# Patient Record
Sex: Female | Born: 1945
Health system: Southern US, Community
[De-identification: ages and names within clinical notes are randomized; demographics above are authoritative.]

## PROBLEM LIST (undated history)

## (undated) DIAGNOSIS — M1990 Unspecified osteoarthritis, unspecified site: Secondary | ICD-10-CM

## (undated) DIAGNOSIS — F419 Anxiety disorder, unspecified: Secondary | ICD-10-CM

## (undated) DIAGNOSIS — E039 Hypothyroidism, unspecified: Secondary | ICD-10-CM

## (undated) DIAGNOSIS — M8580 Other specified disorders of bone density and structure, unspecified site: Secondary | ICD-10-CM

## (undated) HISTORY — DX: Other specified disorders of bone density and structure, unspecified site: M85.80

---

## 1997-05-16 ENCOUNTER — Other Ambulatory Visit: Admission: RE | Admit: 1997-05-16 | Discharge: 1997-05-16 | Payer: Self-pay | Admitting: Obstetrics and Gynecology

## 1997-07-01 ENCOUNTER — Ambulatory Visit (HOSPITAL_COMMUNITY): Admission: RE | Admit: 1997-07-01 | Discharge: 1997-07-01 | Payer: Self-pay | Admitting: Gastroenterology

## 1998-05-26 ENCOUNTER — Other Ambulatory Visit: Admission: RE | Admit: 1998-05-26 | Discharge: 1998-05-26 | Payer: Self-pay | Admitting: Obstetrics and Gynecology

## 1999-07-17 ENCOUNTER — Other Ambulatory Visit: Admission: RE | Admit: 1999-07-17 | Discharge: 1999-07-17 | Payer: Self-pay | Admitting: Obstetrics and Gynecology

## 1999-09-03 ENCOUNTER — Encounter (INDEPENDENT_AMBULATORY_CARE_PROVIDER_SITE_OTHER): Payer: Self-pay

## 1999-09-03 ENCOUNTER — Other Ambulatory Visit: Admission: RE | Admit: 1999-09-03 | Discharge: 1999-09-03 | Payer: Self-pay | Admitting: Obstetrics and Gynecology

## 2000-08-25 ENCOUNTER — Other Ambulatory Visit: Admission: RE | Admit: 2000-08-25 | Discharge: 2000-08-25 | Payer: Self-pay | Admitting: Obstetrics and Gynecology

## 2002-07-20 ENCOUNTER — Ambulatory Visit (HOSPITAL_COMMUNITY): Admission: RE | Admit: 2002-07-20 | Discharge: 2002-07-20 | Payer: Self-pay | Admitting: Gastroenterology

## 2005-07-01 ENCOUNTER — Encounter: Payer: Self-pay | Admitting: Internal Medicine

## 2006-07-08 ENCOUNTER — Encounter: Payer: Self-pay | Admitting: Internal Medicine

## 2007-02-03 ENCOUNTER — Ambulatory Visit: Payer: Self-pay | Admitting: Internal Medicine

## 2007-02-03 DIAGNOSIS — M8580 Other specified disorders of bone density and structure, unspecified site: Secondary | ICD-10-CM | POA: Insufficient documentation

## 2007-02-06 ENCOUNTER — Encounter: Payer: Self-pay | Admitting: Internal Medicine

## 2007-05-05 ENCOUNTER — Ambulatory Visit: Payer: Self-pay | Admitting: Internal Medicine

## 2007-05-08 ENCOUNTER — Telehealth: Payer: Self-pay | Admitting: Internal Medicine

## 2007-08-04 ENCOUNTER — Encounter: Payer: Self-pay | Admitting: Internal Medicine

## 2007-08-18 ENCOUNTER — Ambulatory Visit: Payer: Self-pay | Admitting: Internal Medicine

## 2007-08-18 DIAGNOSIS — M545 Low back pain: Secondary | ICD-10-CM

## 2007-09-11 ENCOUNTER — Telehealth: Payer: Self-pay | Admitting: Internal Medicine

## 2007-09-19 ENCOUNTER — Encounter: Admission: RE | Admit: 2007-09-19 | Discharge: 2007-09-19 | Payer: Self-pay | Admitting: Internal Medicine

## 2007-09-29 ENCOUNTER — Encounter: Payer: Self-pay | Admitting: Internal Medicine

## 2007-11-06 ENCOUNTER — Telehealth (INDEPENDENT_AMBULATORY_CARE_PROVIDER_SITE_OTHER): Payer: Self-pay

## 2008-01-01 ENCOUNTER — Ambulatory Visit: Payer: Self-pay | Admitting: Internal Medicine

## 2008-01-01 DIAGNOSIS — J019 Acute sinusitis, unspecified: Secondary | ICD-10-CM | POA: Insufficient documentation

## 2008-01-08 ENCOUNTER — Telehealth: Payer: Self-pay | Admitting: Family Medicine

## 2008-08-05 ENCOUNTER — Encounter: Payer: Self-pay | Admitting: Internal Medicine

## 2008-10-25 ENCOUNTER — Encounter: Payer: Self-pay | Admitting: Internal Medicine

## 2009-08-06 ENCOUNTER — Encounter: Payer: Self-pay | Admitting: Internal Medicine

## 2009-11-24 ENCOUNTER — Ambulatory Visit: Payer: Self-pay | Admitting: Internal Medicine

## 2010-02-10 NOTE — Assessment & Plan Note (Signed)
Summary: FLU SHOT/CB  Nurse Visit   Allergies: No Known Drug Allergies  Orders Added: 1)  Admin 1st Vaccine [90471] 2)  Flu Vaccine 88yrs + [16109] Flu Vaccine Consent Questions     Do you have a history of severe allergic reactions to this vaccine? no    Any prior history of allergic reactions to egg and/or gelatin? no    Do you have a sensitivity to the preservative Thimersol? no    Do you have a past history of Guillan-Barre Syndrome? no    Do you currently have an acute febrile illness? no    Have you ever had a severe reaction to latex? no    Vaccine information given and explained to patient? yes    Are you currently pregnant? no    Lot Number:[id number]   Exp Date:07/11/2010   Site Given  Left Deltoid IM .lbflu1

## 2010-02-26 ENCOUNTER — Ambulatory Visit
Admission: RE | Admit: 2010-02-26 | Discharge: 2010-02-26 | Disposition: A | Discharge status: Home / Self Care | Payer: No Typology Code available for payment source | Source: Ambulatory Visit | Attending: Orthopedic Surgery | Admitting: Orthopedic Surgery

## 2010-02-26 ENCOUNTER — Other Ambulatory Visit: Payer: Self-pay | Admitting: Orthopedic Surgery

## 2010-02-26 DIAGNOSIS — M25562 Pain in left knee: Secondary | ICD-10-CM

## 2010-05-29 NOTE — Op Note (Signed)
   [name], [name]                       ACCOUNT NO.:  [id number]   MEDICAL RECORD NO.:  [id number]                   PATIENT TYPE:  AMB   LOCATION:  ENDO                                 FACILITY:  [facility]   PHYSICIAN:  John C. Madilyn Fireman, M.D.                 DATE OF BIRTH:  [birth year]   DATE OF PROCEDURE:  07/20/2002  DATE OF DISCHARGE:                                 OPERATIVE REPORT   PROCEDURE:  Colonoscopy.   INDICATION FOR PROCEDURE:  Family history of colon cancer in a first-degree  relative.   DESCRIPTION OF PROCEDURE:  The patient was placed in the left lateral  decubitus position and placed on the pulse monitor with continuous low-flow  oxygen delivered by nasal cannula.  She was sedated with 75 mcg IV fentanyl  and 7 mg IV Versed.  The Olympus video colonoscope was inserted into the  rectum and advanced to the cecum, confirmed by transillumination at  McBurney's point and visualization of the ileocecal valve and appendiceal  orifice.  The prep was excellent.  The cecum, ascending, transverse,  descending, and sigmoid colon all appeared normal with no masses, polyps,  diverticula, or other mucosal abnormalities.  The rectum likewise appeared  normal, and retroflexed view of the anus revealed no obvious internal  hemorrhoids.  The colonoscope was then withdrawn and the patient returned to  the recovery room in stable condition.  She tolerated the procedure well,  and there were no immediate complications.   IMPRESSION:  Normal colonoscopy.   PLAN:  We will repeat study in five years.                                               John C. Madilyn Fireman, M.D.    [facility]/MEDQ  D:  07/20/2002  T:  07/20/2002  Job:  [id number]   cc:   Gordy Savers, M.D. [facility]

## 2010-08-19 ENCOUNTER — Encounter: Payer: Self-pay | Admitting: Internal Medicine

## 2011-03-19 ENCOUNTER — Telehealth: Payer: Self-pay

## 2011-03-19 NOTE — Telephone Encounter (Signed)
Error

## 2011-03-22 ENCOUNTER — Encounter: Payer: Self-pay | Admitting: Internal Medicine

## 2011-03-22 ENCOUNTER — Ambulatory Visit (INDEPENDENT_AMBULATORY_CARE_PROVIDER_SITE_OTHER): Payer: No Typology Code available for payment source | Admitting: Internal Medicine

## 2011-03-22 DIAGNOSIS — J019 Acute sinusitis, unspecified: Secondary | ICD-10-CM

## 2011-03-22 DIAGNOSIS — E039 Hypothyroidism, unspecified: Secondary | ICD-10-CM

## 2011-03-22 DIAGNOSIS — M899 Disorder of bone, unspecified: Secondary | ICD-10-CM

## 2011-03-22 MED ORDER — ZOLPIDEM TARTRATE 10 MG PO TABS: 10.0000 mg | ORAL_TABLET | Freq: Every evening | ORAL | Status: DC | PRN

## 2011-03-22 MED ORDER — LORAZEPAM 0.5 MG PO TABS: 0.5000 mg | ORAL_TABLET | Freq: Two times a day (BID) | ORAL | Status: AC | PRN

## 2011-03-22 NOTE — Progress Notes (Signed)
  Subjective:    Patient ID: [name], female    DOB: 1945-04-03, 66 y.o.   MRN: [id number]  HPI  66 year old patient who is seen today with a seven-day history of sinus congestion drainage and cough. No fever she has a tobacco user. She does have a daughter getting married next month and is anxious to improve. Her grandson has been treated for bronchitis with antibiotic therapy. She is a retired Environmental education officer very concerned about an underactive thyroid it to involuntary weight gain over the years. TSHs been in a high normal range. She is requesting a free T3    Review of Systems  Constitutional: Positive for unexpected weight change.  HENT: Positive for congestion, rhinorrhea and postnasal drip.   Respiratory: Positive for cough.        Objective:   Physical Exam  Constitutional: She is oriented to person, place, and time. She appears well-developed and well-nourished.  HENT:  Head: Normocephalic.  Right Ear: External ear normal.  Left Ear: External ear normal.  Mouth/Throat: Oropharynx is clear and moist.  Eyes: Conjunctivae and EOM are normal. Pupils are equal, round, and reactive to light.  Neck: Normal range of motion. Neck supple. No thyromegaly present.  Cardiovascular: Normal rate, regular rhythm, normal heart sounds and intact distal pulses.   Pulmonary/Chest: Effort normal and breath sounds normal.  Abdominal: Soft. Bowel sounds are normal. She exhibits no mass. There is no tenderness.  Musculoskeletal: Normal range of motion.  Lymphadenopathy:    She has no cervical adenopathy.  Neurological: She is alert and oriented to person, place, and time.  Skin: Skin is warm and dry. No rash noted.  Psychiatric: She has a normal mood and affect. Her behavior is normal.          Assessment & Plan:   Viral rhinosinusitis Situational stress/insomnia Probable euthyroid. We'll check a free T3

## 2011-03-22 NOTE — Patient Instructions (Signed)
Acute sinusitis symptoms for less than 10 days are generally not helped by antibiotic therapy.  Use saline irrigation, warm  moist compresses and over-the-counter decongestants only as directed.  Call if there is no improvement in 5 to 7 days, or sooner if you develop increasing pain, fever, or any new symptoms.    It is important that you exercise regularly, at least 20 minutes 3 to 4 times per week.  If you develop chest pain or shortness of breath seek  medical attention.  Call or return to clinic prn if these symptoms worsen or fail to improve as anticipated.

## 2011-04-27 ENCOUNTER — Ambulatory Visit (INDEPENDENT_AMBULATORY_CARE_PROVIDER_SITE_OTHER): Payer: No Typology Code available for payment source | Admitting: Internal Medicine

## 2011-04-27 ENCOUNTER — Encounter: Payer: Self-pay | Admitting: Internal Medicine

## 2011-04-27 ENCOUNTER — Encounter (INDEPENDENT_AMBULATORY_CARE_PROVIDER_SITE_OTHER): Payer: No Typology Code available for payment source

## 2011-04-27 VITALS — BP 112/70 | Temp 98.2°F | Wt 160.0 lb

## 2011-04-27 DIAGNOSIS — M7989 Other specified soft tissue disorders: Secondary | ICD-10-CM

## 2011-04-27 NOTE — Progress Notes (Signed)
  Subjective:    Patient ID: [name], female    DOB: [birth year], 66 y.o.   MRN: [id number]  HPI 23 -year-old patient who presents with the right leg swelling of one week's duration after returning from a wedding on the [place]. She complains of some right calf tenderness and swelling but no pulmonary complaints. No prior history of DVT. She is a tobacco user    Review of Systems  Constitutional: Negative.   HENT: Negative for hearing loss, congestion, sore throat, rhinorrhea, dental problem, sinus pressure and tinnitus.   Eyes: Negative for pain, discharge and visual disturbance.  Respiratory: Negative for cough and shortness of breath.   Cardiovascular: Negative for chest pain, palpitations and leg swelling.  Gastrointestinal: Negative for nausea, vomiting, abdominal pain, diarrhea, constipation, blood in stool and abdominal distention.  Genitourinary: Negative for dysuria, urgency, frequency, hematuria, flank pain, vaginal bleeding, vaginal discharge, difficulty urinating, vaginal pain and pelvic pain.  Musculoskeletal: Negative for joint swelling, arthralgias and gait problem.       Discomfort and swelling involving the right calf and distal leg  Skin: Negative for rash.  Neurological: Negative for dizziness, syncope, speech difficulty, weakness, numbness and headaches.  Hematological: Negative for adenopathy.  Psychiatric/Behavioral: Negative for behavioral problems, dysphoric mood and agitation. The patient is not nervous/anxious.        Objective:   Physical Exam  Constitutional: She appears well-developed and well-nourished. No distress.  Cardiovascular: Regular rhythm.   Pulmonary/Chest: Effort normal and breath sounds normal.       O2 saturation 98% Pulse rate 74  Musculoskeletal: She exhibits edema.       Mild tenderness right calf and edema distal to the right knee          Assessment & Plan:   Right leg swelling rule out right leg DVT. We'll set up for  a venous Doppler study today.

## 2011-04-27 NOTE — Patient Instructions (Signed)
Venous Doppler study as discussed 

## 2011-04-28 NOTE — Progress Notes (Signed)
Quick Note:  spoke with pt- informed results neg - ______

## 2011-05-18 ENCOUNTER — Other Ambulatory Visit: Payer: Self-pay | Admitting: Internal Medicine

## 2011-05-18 NOTE — Telephone Encounter (Signed)
I dont see where this was rx'd by Dr. Amador Cunas in past or current med list.  Last seen 04/27/11 for leg swelling

## 2011-05-28 ENCOUNTER — Ambulatory Visit (INDEPENDENT_AMBULATORY_CARE_PROVIDER_SITE_OTHER): Payer: No Typology Code available for payment source | Admitting: Internal Medicine

## 2011-05-28 ENCOUNTER — Encounter: Payer: Self-pay | Admitting: Internal Medicine

## 2011-05-28 VITALS — BP 110/70 | Temp 98.2°F | Wt 156.0 lb

## 2011-05-28 DIAGNOSIS — J019 Acute sinusitis, unspecified: Secondary | ICD-10-CM

## 2011-05-28 MED ORDER — AMOXICILLIN-POT CLAVULANATE 875-125 MG PO TABS: 1.0000 | ORAL_TABLET | Freq: Two times a day (BID) | ORAL | Status: AC

## 2011-05-28 NOTE — Patient Instructions (Signed)
    Use saline irrigation, warm  moist compresses and over-the-counter decongestants only as directed.  Call if there is no improvement in 5 to 7 days, or sooner if you develop increasing pain, fever, or any new symptoms.  Take your antibiotic as prescribed until ALL of it is gone, but stop if you develop a rash, swelling, or any side effects of the medication.  Contact our office as soon as possible if  there are side effects of the medication. 

## 2011-05-28 NOTE — Progress Notes (Signed)
  Subjective:    Patient ID: [name], female    DOB: [birth year], 66 y.o.   MRN: [id number]  HPI  66 year old patient who presents with a greater than two-week history of sinus congestion and purulent drainage. She feels unwell she has congestion drainage and also productive cough there's been some intermittent low-grade fever. She has had sinus infections in the past. She has been using a number of OTC medications without much benefit including decongestants nasal irrigation and expectorants    Review of Systems  Constitutional: Negative.   HENT: Positive for congestion, postnasal drip and sinus pressure. Negative for hearing loss, sore throat, rhinorrhea, dental problem and tinnitus.   Eyes: Negative for pain, discharge and visual disturbance.  Respiratory: Positive for cough. Negative for shortness of breath.   Cardiovascular: Negative for chest pain, palpitations and leg swelling.  Gastrointestinal: Negative for nausea, vomiting, abdominal pain, diarrhea, constipation, blood in stool and abdominal distention.  Genitourinary: Negative for dysuria, urgency, frequency, hematuria, flank pain, vaginal bleeding, vaginal discharge, difficulty urinating, vaginal pain and pelvic pain.  Musculoskeletal: Negative for joint swelling, arthralgias and gait problem.  Skin: Negative for rash.  Neurological: Negative for dizziness, syncope, speech difficulty, weakness, numbness and headaches.  Hematological: Negative for adenopathy.  Psychiatric/Behavioral: Negative for behavioral problems, dysphoric mood and agitation. The patient is not nervous/anxious.        Objective:   Physical Exam  Constitutional: She is oriented to person, place, and time. She appears well-developed and well-nourished.  HENT:  Head: Normocephalic.  Right Ear: External ear normal.  Left Ear: External ear normal.  Mouth/Throat: Oropharynx is clear and moist.  Eyes: Conjunctivae and EOM are normal. Pupils are equal,  round, and reactive to light.  Neck: Normal range of motion. Neck supple. No thyromegaly present.  Cardiovascular: Normal rate, regular rhythm, normal heart sounds and intact distal pulses.   Pulmonary/Chest: Effort normal and breath sounds normal.  Abdominal: Soft. Bowel sounds are normal. She exhibits no mass. There is no tenderness.  Musculoskeletal: Normal range of motion.  Lymphadenopathy:    She has no cervical adenopathy.  Neurological: She is alert and oriented to person, place, and time.  Skin: Skin is warm and dry. No rash noted.  Psychiatric: She has a normal mood and affect. Her behavior is normal.          Assessment & Plan:   Acute sinusitis. We'll continue irrigation expectorants and decongestants. We'll treat with Augmentin for 10 days. She'll call if unimproved.

## 2011-06-21 ENCOUNTER — Other Ambulatory Visit: Payer: Self-pay

## 2011-06-21 MED ORDER — ZOLPIDEM TARTRATE 10 MG PO TABS: 10.0000 mg | ORAL_TABLET | Freq: Every evening | ORAL | Status: DC | PRN

## 2011-07-20 DIAGNOSIS — M949 Disorder of cartilage, unspecified: Secondary | ICD-10-CM | POA: Diagnosis not present

## 2011-07-20 DIAGNOSIS — M899 Disorder of bone, unspecified: Secondary | ICD-10-CM | POA: Diagnosis not present

## 2011-07-20 LAB — HM DEXA SCAN

## 2011-07-26 ENCOUNTER — Encounter: Payer: Self-pay | Admitting: Internal Medicine

## 2011-08-23 DIAGNOSIS — H2510 Age-related nuclear cataract, unspecified eye: Secondary | ICD-10-CM | POA: Diagnosis not present

## 2011-08-23 DIAGNOSIS — H40019 Open angle with borderline findings, low risk, unspecified eye: Secondary | ICD-10-CM | POA: Diagnosis not present

## 2011-08-23 DIAGNOSIS — H25019 Cortical age-related cataract, unspecified eye: Secondary | ICD-10-CM | POA: Diagnosis not present

## 2011-09-16 DIAGNOSIS — Z1231 Encounter for screening mammogram for malignant neoplasm of breast: Secondary | ICD-10-CM | POA: Diagnosis not present

## 2011-09-20 ENCOUNTER — Telehealth: Payer: Self-pay | Admitting: Internal Medicine

## 2011-09-20 ENCOUNTER — Encounter: Payer: Self-pay | Admitting: Internal Medicine

## 2011-09-20 MED ORDER — ETODOLAC 200 MG PO CAPS: 200.0000 mg | ORAL_CAPSULE | Freq: Two times a day (BID) | ORAL | Status: AC

## 2011-09-20 NOTE — Telephone Encounter (Signed)
Ok for generic lodine  #60  One BID  RF 5

## 2011-09-20 NOTE — Telephone Encounter (Signed)
Last seen here 05/28/11 Please advise

## 2011-09-20 NOTE — Telephone Encounter (Signed)
Caller: [name]/Patient; Patient Name: [name]; PCP: Eleonore Chiquito [facility]); Best Callback Phone Number: [phone]; Call regarding Lodine script for Arthritis in Knees.  Patient had Root Canal 3 weeks ago, was given Lodine for inflammation, Patient had improvement with Knee pain also, requesting script.  Per Patient, I disussed with Dr Amador Cunas at my last visit.  All emergent symptoms ruled out per Knee non-injury Protocol, home care.  Patient last seen on 05-28-11 and [facility], [phone].  Please follow up with Patient.

## 2011-09-20 NOTE — Telephone Encounter (Signed)
Patient called stating that per the md he stated he would call her shingles vaccine [facility] at [address]. Please assist.

## 2011-09-20 NOTE — Telephone Encounter (Signed)
[facility]

## 2011-09-20 NOTE — Telephone Encounter (Signed)
[facility]

## 2011-09-21 DIAGNOSIS — N6489 Other specified disorders of breast: Secondary | ICD-10-CM | POA: Diagnosis not present

## 2011-09-30 ENCOUNTER — Encounter: Payer: Self-pay | Admitting: Internal Medicine

## 2011-11-18 ENCOUNTER — Other Ambulatory Visit: Payer: Self-pay | Admitting: Internal Medicine

## 2011-12-16 DIAGNOSIS — Z124 Encounter for screening for malignant neoplasm of cervix: Secondary | ICD-10-CM | POA: Diagnosis not present

## 2011-12-16 DIAGNOSIS — Z0000 Encounter for general adult medical examination without abnormal findings: Secondary | ICD-10-CM | POA: Diagnosis not present

## 2011-12-16 DIAGNOSIS — Z01419 Encounter for gynecological examination (general) (routine) without abnormal findings: Secondary | ICD-10-CM | POA: Diagnosis not present

## 2012-01-12 HISTORY — PX: COLONOSCOPY: SHX174

## 2012-04-11 ENCOUNTER — Other Ambulatory Visit: Payer: Self-pay | Admitting: Internal Medicine

## 2012-06-27 DIAGNOSIS — L821 Other seborrheic keratosis: Secondary | ICD-10-CM | POA: Diagnosis not present

## 2012-06-27 DIAGNOSIS — L819 Disorder of pigmentation, unspecified: Secondary | ICD-10-CM | POA: Diagnosis not present

## 2012-06-27 DIAGNOSIS — C44529 Squamous cell carcinoma of skin of other part of trunk: Secondary | ICD-10-CM | POA: Diagnosis not present

## 2012-08-03 DIAGNOSIS — Z85828 Personal history of other malignant neoplasm of skin: Secondary | ICD-10-CM | POA: Diagnosis not present

## 2012-08-23 DIAGNOSIS — Z23 Encounter for immunization: Secondary | ICD-10-CM | POA: Diagnosis not present

## 2012-09-05 DIAGNOSIS — H40019 Open angle with borderline findings, low risk, unspecified eye: Secondary | ICD-10-CM | POA: Diagnosis not present

## 2012-09-05 DIAGNOSIS — H2589 Other age-related cataract: Secondary | ICD-10-CM | POA: Diagnosis not present

## 2012-09-05 DIAGNOSIS — H43819 Vitreous degeneration, unspecified eye: Secondary | ICD-10-CM | POA: Diagnosis not present

## 2012-10-02 ENCOUNTER — Encounter: Payer: Self-pay | Admitting: Internal Medicine

## 2012-10-02 ENCOUNTER — Ambulatory Visit (INDEPENDENT_AMBULATORY_CARE_PROVIDER_SITE_OTHER): Payer: Medicare Other | Admitting: Internal Medicine

## 2012-10-02 VITALS — BP 120/74 | HR 82 | Temp 98.2°F | Resp 20 | Wt 157.0 lb

## 2012-10-02 DIAGNOSIS — J019 Acute sinusitis, unspecified: Secondary | ICD-10-CM

## 2012-10-02 MED ORDER — LORAZEPAM 0.5 MG PO TABS: 0.5000 mg | ORAL_TABLET | Freq: Every day | ORAL | Status: DC | PRN

## 2012-10-02 MED ORDER — ZOLPIDEM TARTRATE 10 MG PO TABS: ORAL_TABLET | ORAL | Status: DC

## 2012-10-02 MED ORDER — AMOXICILLIN-POT CLAVULANATE 875-125 MG PO TABS: 1.0000 | ORAL_TABLET | Freq: Two times a day (BID) | ORAL | Status: DC

## 2012-10-02 NOTE — Patient Instructions (Addendum)
Use saline irrigation, warm  moist compresses and over-the-counter decongestants only as directed.  Call if there is no improvement in 5 to 7 days, or sooner if you develop increasing pain, fever, or any new symptoms.  Continue annual gynecologic evaluations

## 2012-10-02 NOTE — Progress Notes (Signed)
Subjective:    Patient ID: [name], female    DOB: 1945-04-22, 67 y.o.   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 No definite fever. She's had worsening headaches postnasal drip in spite of OTC medications that have included decongestants as well as expectorants and analgesics. She has been treated for sinusitis in the past. Her last episode of acute sinusitis was approximately 18 months ago  Past Medical History  Diagnosis Date  . Osteopenia     History   Social History  . Marital Status: Married    Spouse Name: N/A    Number of Children: N/A  . Years of Education: N/A   Occupational History  . Not on file.   Social History Main Topics  . Smoking status: Former Smoker    Types: Cigarettes    Quit date: 03/12/2011  . Smokeless tobacco: Never Used  . Alcohol Use: Yes  . Drug Use: No  . Sexual Activity: Not on file   Other Topics Concern  . Not on file   Social History Narrative  . No narrative on file    History reviewed. No pertinent past surgical history.  Family History  Problem Relation Age of Onset  . Heart disease Mother   . Arthritis Mother   . Hypertension Father   . Arthritis Father     No Known Allergies  Current Outpatient Prescriptions on File Prior to Visit  Medication Sig Dispense Refill  . Multiple Vitamin (MULTIVITAMIN) tablet Take 1 tablet by mouth daily.      . vitamin C (ASCORBIC ACID) 500 MG tablet Take 500 mg by mouth daily.      . vitamin E 400 UNIT capsule Take 400 Units by mouth daily.       No current facility-administered medications on file prior to visit.    BP 120/74  Pulse 82  Temp(Src) 98.2 F (36.8 C) (Oral)  Resp 20  Wt 157 lb (71.215 kg)  BMI 27.82 kg/m2  SpO2 97%       Review of Systems  Constitutional: Negative.   HENT: Positive for congestion, rhinorrhea and sinus pressure. Negative for  hearing loss, sore throat, dental problem and tinnitus.   Eyes: Negative for pain, discharge and visual disturbance.  Respiratory: Negative for cough and shortness of breath.   Cardiovascular: Negative for chest pain, palpitations and leg swelling.  Gastrointestinal: Negative for nausea, vomiting, abdominal pain, diarrhea, constipation, blood in stool and abdominal distention.  Genitourinary: Negative for dysuria, urgency, frequency, hematuria, flank pain, vaginal bleeding, vaginal discharge, difficulty urinating, vaginal pain and pelvic pain.  Musculoskeletal: Negative for joint swelling, arthralgias and gait problem.  Skin: Negative for rash.  Neurological: Positive for headaches. Negative for dizziness, syncope, speech difficulty, weakness and numbness.  Hematological: Negative for adenopathy.  Psychiatric/Behavioral: Negative for behavioral problems, dysphoric mood and agitation. The patient is not nervous/anxious.        Objective:   Physical Exam  Constitutional: She is oriented to person, place, and time. She appears well-developed and well-nourished.  HENT:  Head: Normocephalic.  Right Ear: External ear normal.  Left Ear: External ear normal.  Mouth/Throat: Oropharynx is clear and moist.  Mild maxillary sinus tenderness  Eyes: Conjunctivae and EOM are normal. Pupils are equal, round, and reactive to light.  Neck: Normal range of motion. Neck supple. No thyromegaly present.  Cardiovascular: Normal rate, regular rhythm, normal heart  sounds and intact distal pulses.   Pulmonary/Chest: Effort normal and breath sounds normal.  Abdominal: Soft. Bowel sounds are normal. She exhibits no mass. There is no tenderness.  Musculoskeletal: Normal range of motion.  Lymphadenopathy:    She has no cervical adenopathy.  Neurological: She is alert and oriented to person, place, and time.  Skin: Skin is warm and dry. No rash noted.  Psychiatric: She has a normal mood and affect. Her behavior is  normal.          Assessment & Plan:   Subacute sinusitis. Will continue expectorants saline irrigation and treat with 10 days of Augmentin History of osteopenia  Recommend annual gynecologic evaluation where she obtains her primary care

## 2012-10-05 DIAGNOSIS — Z1231 Encounter for screening mammogram for malignant neoplasm of breast: Secondary | ICD-10-CM | POA: Diagnosis not present

## 2012-10-16 ENCOUNTER — Encounter: Payer: Self-pay | Admitting: Internal Medicine

## 2012-10-16 ENCOUNTER — Ambulatory Visit (INDEPENDENT_AMBULATORY_CARE_PROVIDER_SITE_OTHER): Payer: Medicare Other | Admitting: Internal Medicine

## 2012-10-16 ENCOUNTER — Telehealth: Payer: Self-pay | Admitting: Internal Medicine

## 2012-10-16 VITALS — BP 140/80 | HR 83 | Temp 97.9°F | Resp 20 | Wt 156.0 lb

## 2012-10-16 DIAGNOSIS — J019 Acute sinusitis, unspecified: Secondary | ICD-10-CM | POA: Diagnosis not present

## 2012-10-16 MED ORDER — FLUTICASONE PROPIONATE 50 MCG/ACT NA SUSP: 2.0000 | Freq: Every day | NASAL | Status: DC

## 2012-10-16 MED ORDER — PREDNISONE 20 MG PO TABS: 20.0000 mg | ORAL_TABLET | Freq: Two times a day (BID) | ORAL | Status: DC

## 2012-10-16 MED ORDER — AZITHROMYCIN 250 MG PO TABS: ORAL_TABLET | ORAL | Status: DC

## 2012-10-16 NOTE — Patient Instructions (Signed)
Use saline irrigation, warm  moist compresses and over-the-counter decongestants only as directed.  Call if there is no improvement in 5 to 7 days, or sooner if you develop increasing pain, fever, or any new symptoms.  Allegra D. twice daily

## 2012-10-16 NOTE — Telephone Encounter (Signed)
Patient Information:  Caller Name: Shaton  Phone: [phone]  Patient: [name], [name]  Gender: Female  DOB: [birth year]  Age: 67 Years  PCP: Eleonore Chiquito (Family Practice > 19yrs old)  Office Follow Up:  Does the office need to follow up with this patient?: N/A  Instructions For The Office: N/A  RN Note:  No headache, ear pain, or facial pain.  Audible ear popping and ear fullness present.  Audibly congested. Advised must be seen for antibioitics per MD order. Hydrate and humidify to loosen congestion  Symptoms  Reason For Call & Symptoms: Ongoing sinus congestion, yellow/green drainage and ear congestion.  Sinus infection diagnosed 10/02/12; treated with 10 days of Augmentin that ended  10/11/12.  Reviewed Health History In EMR: Yes  Reviewed Medications In EMR: Yes  Reviewed Allergies In EMR: Yes  Reviewed Surgeries / Procedures: Yes  Date of Onset of Symptoms: 09/18/2012  Treatments Tried: Sudafed, Advil, Guaifenesin, nasal saline, > fluid intake  Treatments Tried Worked: No  Guideline(s) Used:  Sinus Pain and Congestion  Disposition Per Guideline:   See Today or Tomorrow in Office  Reason For Disposition Reached:   Sinus congestion (pressure, fullness) present > 10 days  Advice Given:  N/A  Patient Will Follow Care Advice:  YES  Appointment Scheduled:  10/16/2012 16:00:00 Appointment Scheduled Provider:  Eleonore Chiquito (Family Practice > 2yrs old)

## 2012-10-16 NOTE — Progress Notes (Signed)
Subjective:    Patient ID: [name], female    DOB: [birth year], 67 y.o.   MRN: [id number]  HPI  67 year old patient who was seen recently and treated for subacute sinusitis with Augmentin for 10 days. She seemed to improve somewhat but now has persistent sinus congestion and minimal drainage. There's been no fever. Denies any focal sinus tenderness  She's a former smoker but discontinued about one year ago  Past Medical History  Diagnosis Date  . Osteopenia     History   Social History  . Marital Status: Married    Spouse Name: N/A    Number of Children: N/A  . Years of Education: N/A   Occupational History  . Not on file.   Social History Main Topics  . Smoking status: Former Smoker    Types: Cigarettes    Quit date: 03/12/2011  . Smokeless tobacco: Never Used  . Alcohol Use: Yes  . Drug Use: No  . Sexual Activity: Not on file   Other Topics Concern  . Not on file   Social History Narrative  . No narrative on file    History reviewed. No pertinent past surgical history.  Family History  Problem Relation Age of Onset  . Heart disease Mother   . Arthritis Mother   . Hypertension Father   . Arthritis Father     No Known Allergies  Current Outpatient Prescriptions on File Prior to Visit  Medication Sig Dispense Refill  . LORazepam (ATIVAN) 0.5 MG tablet Take 1 tablet (0.5 mg total) by mouth daily as needed.  30 tablet  2  . Multiple Vitamin (MULTIVITAMIN) tablet Take 1 tablet by mouth daily.      . vitamin C (ASCORBIC ACID) 500 MG tablet Take 500 mg by mouth daily.      . vitamin E 400 UNIT capsule Take 400 Units by mouth daily.      Marland Kitchen zolpidem (AMBIEN) 10 MG tablet TAKE 1 TABLET AT BEDTIME AS NEEDED FOR SLEEP  30 tablet  2   No current facility-administered medications on file prior to visit.    BP 140/80  Pulse 83  Temp(Src) 97.9 F (36.6 C) (Oral)  Resp 20  Wt 156 lb (70.761 kg)  BMI 27.64 kg/m2  SpO2 98%       Review of Systems   Constitutional: Positive for fatigue.  HENT: Positive for congestion, rhinorrhea and postnasal drip. Negative for hearing loss, sore throat, dental problem, sinus pressure and tinnitus.   Eyes: Negative for pain, discharge and visual disturbance.  Respiratory: Negative for cough and shortness of breath.   Cardiovascular: Negative for chest pain, palpitations and leg swelling.  Gastrointestinal: Negative for nausea, vomiting, abdominal pain, diarrhea, constipation, blood in stool and abdominal distention.  Genitourinary: Negative for dysuria, urgency, frequency, hematuria, flank pain, vaginal bleeding, vaginal discharge, difficulty urinating, vaginal pain and pelvic pain.  Musculoskeletal: Negative for joint swelling, arthralgias and gait problem.  Skin: Negative for rash.  Neurological: Negative for dizziness, syncope, speech difficulty, weakness, numbness and headaches.  Hematological: Negative for adenopathy.  Psychiatric/Behavioral: Negative for behavioral problems, dysphoric mood and agitation. The patient is not nervous/anxious.        Objective:   Physical Exam  Constitutional: She is oriented to person, place, and time. She appears well-developed and well-nourished.  HENT:  Head: Normocephalic.  Right Ear: External ear normal.  Left Ear: External ear normal.  Mouth/Throat: Oropharynx is clear and moist.  Eyes: Conjunctivae and EOM are normal. Pupils  are equal, round, and reactive to light.  Neck: Normal range of motion. Neck supple. No thyromegaly present.  Cardiovascular: Normal rate, regular rhythm, normal heart sounds and intact distal pulses.   Pulmonary/Chest: Effort normal and breath sounds normal.  Abdominal: Soft. Bowel sounds are normal. She exhibits no mass. There is no tenderness.  Musculoskeletal: Normal range of motion.  Lymphadenopathy:    She has no cervical adenopathy.  Neurological: She is alert and oriented to person, place, and time.  Skin: Skin is warm and  dry. No rash noted.  Psychiatric: She has a normal mood and affect. Her behavior is normal.          Assessment & Plan:   Sinus congestion. Will treat aggressively with a short course of oral prednisone continue decongestants expectorants sinus irrigation.  We'll retreat with azithromycin to cover the possibility of a low-grade sinusitis

## 2012-11-08 DIAGNOSIS — M1710 Unilateral primary osteoarthritis, unspecified knee: Secondary | ICD-10-CM | POA: Diagnosis not present

## 2012-11-24 DIAGNOSIS — Z1211 Encounter for screening for malignant neoplasm of colon: Secondary | ICD-10-CM | POA: Diagnosis not present

## 2012-11-24 DIAGNOSIS — K5730 Diverticulosis of large intestine without perforation or abscess without bleeding: Secondary | ICD-10-CM | POA: Diagnosis not present

## 2012-11-24 DIAGNOSIS — Z800 Family history of malignant neoplasm of digestive organs: Secondary | ICD-10-CM | POA: Diagnosis not present

## 2012-11-29 DIAGNOSIS — Z85828 Personal history of other malignant neoplasm of skin: Secondary | ICD-10-CM | POA: Diagnosis not present

## 2012-11-29 DIAGNOSIS — L905 Scar conditions and fibrosis of skin: Secondary | ICD-10-CM | POA: Diagnosis not present

## 2012-11-29 DIAGNOSIS — L819 Disorder of pigmentation, unspecified: Secondary | ICD-10-CM | POA: Diagnosis not present

## 2012-11-29 DIAGNOSIS — L821 Other seborrheic keratosis: Secondary | ICD-10-CM | POA: Diagnosis not present

## 2012-11-29 DIAGNOSIS — D235 Other benign neoplasm of skin of trunk: Secondary | ICD-10-CM | POA: Diagnosis not present

## 2012-12-13 ENCOUNTER — Encounter: Payer: Self-pay | Admitting: Internal Medicine

## 2012-12-26 DIAGNOSIS — Z0000 Encounter for general adult medical examination without abnormal findings: Secondary | ICD-10-CM | POA: Diagnosis not present

## 2012-12-26 DIAGNOSIS — Z01419 Encounter for gynecological examination (general) (routine) without abnormal findings: Secondary | ICD-10-CM | POA: Diagnosis not present

## 2012-12-26 DIAGNOSIS — Z124 Encounter for screening for malignant neoplasm of cervix: Secondary | ICD-10-CM | POA: Diagnosis not present

## 2012-12-27 DIAGNOSIS — Z124 Encounter for screening for malignant neoplasm of cervix: Secondary | ICD-10-CM | POA: Diagnosis not present

## 2012-12-29 DIAGNOSIS — Z23 Encounter for immunization: Secondary | ICD-10-CM | POA: Diagnosis not present

## 2013-05-30 DIAGNOSIS — D485 Neoplasm of uncertain behavior of skin: Secondary | ICD-10-CM | POA: Diagnosis not present

## 2013-05-30 DIAGNOSIS — L723 Sebaceous cyst: Secondary | ICD-10-CM | POA: Diagnosis not present

## 2013-05-30 DIAGNOSIS — L905 Scar conditions and fibrosis of skin: Secondary | ICD-10-CM | POA: Diagnosis not present

## 2013-05-30 DIAGNOSIS — L910 Hypertrophic scar: Secondary | ICD-10-CM | POA: Diagnosis not present

## 2013-07-20 DIAGNOSIS — M899 Disorder of bone, unspecified: Secondary | ICD-10-CM | POA: Diagnosis not present

## 2013-07-20 DIAGNOSIS — M949 Disorder of cartilage, unspecified: Secondary | ICD-10-CM | POA: Diagnosis not present

## 2013-07-20 LAB — HM DEXA SCAN

## 2013-08-08 ENCOUNTER — Encounter: Payer: Self-pay | Admitting: Internal Medicine

## 2013-09-11 LAB — HM MAMMOGRAPHY

## 2013-09-26 DIAGNOSIS — H40019 Open angle with borderline findings, low risk, unspecified eye: Secondary | ICD-10-CM | POA: Diagnosis not present

## 2013-09-26 DIAGNOSIS — H2510 Age-related nuclear cataract, unspecified eye: Secondary | ICD-10-CM | POA: Diagnosis not present

## 2013-10-08 DIAGNOSIS — Z803 Family history of malignant neoplasm of breast: Secondary | ICD-10-CM | POA: Diagnosis not present

## 2013-10-08 DIAGNOSIS — Z1231 Encounter for screening mammogram for malignant neoplasm of breast: Secondary | ICD-10-CM | POA: Diagnosis not present

## 2013-10-29 DIAGNOSIS — Z23 Encounter for immunization: Secondary | ICD-10-CM | POA: Diagnosis not present

## 2013-12-05 DIAGNOSIS — M1712 Unilateral primary osteoarthritis, left knee: Secondary | ICD-10-CM | POA: Diagnosis not present

## 2013-12-27 DIAGNOSIS — H0011 Chalazion right upper eyelid: Secondary | ICD-10-CM | POA: Diagnosis not present

## 2013-12-27 DIAGNOSIS — H16101 Unspecified superficial keratitis, right eye: Secondary | ICD-10-CM | POA: Diagnosis not present

## 2014-01-18 DIAGNOSIS — Z0000 Encounter for general adult medical examination without abnormal findings: Secondary | ICD-10-CM | POA: Diagnosis not present

## 2014-01-18 DIAGNOSIS — Z01419 Encounter for gynecological examination (general) (routine) without abnormal findings: Secondary | ICD-10-CM | POA: Diagnosis not present

## 2014-05-31 DIAGNOSIS — L821 Other seborrheic keratosis: Secondary | ICD-10-CM | POA: Diagnosis not present

## 2014-05-31 DIAGNOSIS — L814 Other melanin hyperpigmentation: Secondary | ICD-10-CM | POA: Diagnosis not present

## 2014-05-31 DIAGNOSIS — L820 Inflamed seborrheic keratosis: Secondary | ICD-10-CM | POA: Diagnosis not present

## 2014-05-31 DIAGNOSIS — D225 Melanocytic nevi of trunk: Secondary | ICD-10-CM | POA: Diagnosis not present

## 2014-05-31 DIAGNOSIS — L570 Actinic keratosis: Secondary | ICD-10-CM | POA: Diagnosis not present

## 2014-07-02 ENCOUNTER — Encounter: Payer: Self-pay | Admitting: *Deleted

## 2014-10-01 DIAGNOSIS — H40013 Open angle with borderline findings, low risk, bilateral: Secondary | ICD-10-CM | POA: Diagnosis not present

## 2014-10-01 DIAGNOSIS — H2513 Age-related nuclear cataract, bilateral: Secondary | ICD-10-CM | POA: Diagnosis not present

## 2014-10-23 DIAGNOSIS — Z23 Encounter for immunization: Secondary | ICD-10-CM | POA: Diagnosis not present

## 2014-10-23 DIAGNOSIS — Z1231 Encounter for screening mammogram for malignant neoplasm of breast: Secondary | ICD-10-CM | POA: Diagnosis not present

## 2014-10-23 LAB — HM MAMMOGRAPHY: HM Mammogram: NEGATIVE

## 2014-11-07 ENCOUNTER — Encounter: Payer: Self-pay | Admitting: Internal Medicine

## 2015-01-21 ENCOUNTER — Other Ambulatory Visit (INDEPENDENT_AMBULATORY_CARE_PROVIDER_SITE_OTHER): Payer: Medicare Other

## 2015-01-21 DIAGNOSIS — Z0000 Encounter for general adult medical examination without abnormal findings: Secondary | ICD-10-CM

## 2015-01-21 DIAGNOSIS — D649 Anemia, unspecified: Secondary | ICD-10-CM

## 2015-01-21 DIAGNOSIS — E039 Hypothyroidism, unspecified: Secondary | ICD-10-CM

## 2015-01-21 DIAGNOSIS — E785 Hyperlipidemia, unspecified: Secondary | ICD-10-CM

## 2015-01-21 DIAGNOSIS — I519 Heart disease, unspecified: Secondary | ICD-10-CM

## 2015-01-21 LAB — CBC WITH DIFFERENTIAL/PLATELET
BASOS PCT: 0.7 % (ref 0.0–3.0)
Basophils Absolute: 0 10*3/uL (ref 0.0–0.1)
EOS ABS: 0.1 10*3/uL (ref 0.0–0.7)
EOS PCT: 1.8 % (ref 0.0–5.0)
HEMATOCRIT: 41.8 % (ref 36.0–46.0)
HEMOGLOBIN: 14 g/dL (ref 12.0–15.0)
Lymphocytes Relative: 48 % — ABNORMAL HIGH (ref 12.0–46.0)
Lymphs Abs: 2.6 10*3/uL (ref 0.7–4.0)
MCHC: 33.5 g/dL (ref 30.0–36.0)
MCV: 97.6 fl (ref 78.0–100.0)
Monocytes Absolute: 0.4 10*3/uL (ref 0.1–1.0)
Monocytes Relative: 7.9 % (ref 3.0–12.0)
Neutro Abs: 2.3 10*3/uL (ref 1.4–7.7)
Neutrophils Relative %: 41.6 % — ABNORMAL LOW (ref 43.0–77.0)
Platelets: 268 10*3/uL (ref 150.0–400.0)
RBC: 4.29 Mil/uL (ref 3.87–5.11)
RDW: 12.7 % (ref 11.5–15.5)
WBC: 5.5 10*3/uL (ref 4.0–10.5)

## 2015-01-21 LAB — TSH: TSH: 4.16 u[IU]/mL (ref 0.35–4.50)

## 2015-01-21 LAB — POCT URINALYSIS DIPSTICK
BILIRUBIN UA: NEGATIVE
Glucose, UA: NEGATIVE
KETONES UA: NEGATIVE
LEUKOCYTES UA: NEGATIVE
Nitrite, UA: NEGATIVE
PH UA: 6
PROTEIN UA: NEGATIVE
RBC UA: NEGATIVE
SPEC GRAV UA: 1.01
Urobilinogen, UA: 0.2

## 2015-01-21 LAB — HEPATIC FUNCTION PANEL
ALBUMIN: 4.2 g/dL (ref 3.5–5.2)
ALK PHOS: 59 U/L (ref 39–117)
ALT: 15 U/L (ref 0–35)
AST: 17 U/L (ref 0–37)
Bilirubin, Direct: 0.1 mg/dL (ref 0.0–0.3)
TOTAL PROTEIN: 6.9 g/dL (ref 6.0–8.3)
Total Bilirubin: 0.6 mg/dL (ref 0.2–1.2)

## 2015-01-21 LAB — LIPID PANEL
CHOL/HDL RATIO: 3
Cholesterol: 192 mg/dL (ref 0–200)
HDL: 62.1 mg/dL (ref >39.00)
LDL CALC: 107 mg/dL — AB (ref 0–99)
NonHDL: 129.65
TRIGLYCERIDES: 111 mg/dL (ref 0.0–149.0)
VLDL: 22.2 mg/dL (ref 0.0–40.0)

## 2015-01-21 LAB — BASIC METABOLIC PANEL
BUN: 14 mg/dL (ref 6–23)
CALCIUM: 9.2 mg/dL (ref 8.4–10.5)
CO2: 29 mEq/L (ref 19–32)
CREATININE: 0.62 mg/dL (ref 0.40–1.20)
Chloride: 103 mEq/L (ref 96–112)
GFR: 101.59 mL/min (ref >60.00)
GLUCOSE: 85 mg/dL (ref 70–99)
Potassium: 4.5 mEq/L (ref 3.5–5.1)
SODIUM: 138 meq/L (ref 135–145)

## 2015-01-28 ENCOUNTER — Ambulatory Visit (INDEPENDENT_AMBULATORY_CARE_PROVIDER_SITE_OTHER): Payer: Medicare Other | Admitting: Internal Medicine

## 2015-01-28 ENCOUNTER — Encounter: Payer: Self-pay | Admitting: Internal Medicine

## 2015-01-28 VITALS — BP 160/90 | HR 79 | Temp 98.0°F | Resp 20 | Ht 61.5 in | Wt 155.0 lb

## 2015-01-28 DIAGNOSIS — M1712 Unilateral primary osteoarthritis, left knee: Secondary | ICD-10-CM

## 2015-01-28 DIAGNOSIS — Z0000 Encounter for general adult medical examination without abnormal findings: Secondary | ICD-10-CM

## 2015-01-28 MED ORDER — LORAZEPAM 0.5 MG PO TABS: 0.5000 mg | ORAL_TABLET | Freq: Every day | ORAL | Status: DC | PRN

## 2015-01-28 MED ORDER — ZOLPIDEM TARTRATE 10 MG PO TABS: ORAL_TABLET | ORAL | Status: DC

## 2015-01-28 NOTE — Progress Notes (Signed)
Subjective:    Patient ID: [name], female    DOB: [birth year], 70 y.o.   MRN: [id number]  HPI  70 year old patient who is seen today for a preventive health examination. Patient has a history of osteoarthritis and has been told she may need left knee total replacement therapy. She has occasional insomnia but rarely requires medications.  Family history father died at 69 history of early dementia and hypertension Mother died age 69, required pneumonia, history of hypothyroidism and hypertension  2 brothers one sister  Social history married, retired Therapist, sports.  2 daughters and multiple grandchildren  Past Medical History  Diagnosis Date  . Osteopenia     Social History   Social History  . Marital Status: Married    Spouse Name: N/A  . Number of Children: N/A  . Years of Education: N/A   Occupational History  . Not on file.   Social History Main Topics  . Smoking status: Former Smoker    Types: Cigarettes    Quit date: 03/12/2011  . Smokeless tobacco: Never Used  . Alcohol Use: Yes  . Drug Use: No  . Sexual Activity: Not on file   Other Topics Concern  . Not on file   Social History Narrative    No past surgical history on file.  Family History  Problem Relation Age of Onset  . Heart disease Mother   . Arthritis Mother   . Hypertension Father   . Arthritis Father     No Known Allergies  Current Outpatient Prescriptions on File Prior to Visit  Medication Sig Dispense Refill  . Multiple Vitamin (MULTIVITAMIN) tablet Take 1 tablet by mouth daily.    . vitamin C (ASCORBIC ACID) 500 MG tablet Take 500 mg by mouth daily.    . vitamin E 400 UNIT capsule Take 400 Units by mouth daily.     No current facility-administered medications on file prior to visit.    BP 160/90 mmHg  Pulse 79  Temp(Src) 98 F (36.7 C) (Oral)  Resp 20  Ht 5' 1.5" (1.562 m)  Wt 155 lb (70.308 kg)  BMI 28.82 kg/m2  SpO2 99%  1. Risk factors, based on past  M,S,F history.   No significant cardiovascular risk factors  2.  Physical activities: Limited somewhat by left knee pain, but no significant restrictions  3.  Depression/mood: No history depression or mood disorder  4.  Hearing: No deficits  5.  ADL's: Independent  6.  Fall risk: Low  7.  Home safety: No problems identified  8.  Height weight, and visual acuity; height and weight stable no change in visual acuity is followed by ophthalmology and is a glaucoma suspect.  Has had 2 bone density studies  9.  Counseling: Continue heart healthy diet and regular exercise  10. Lab orders based on risk factors: Laboratory profile including lipid profile reviewed  11. Referral : Follow-up OB/GYN  12. Care plan: Continue heart healthy diet  13. Cognitive assessment: Alert in order with normal affect no cognitive dysfunction  14. Screening: Patient provided with a written and personalized 5-10 year screening schedule in the AVS.  .  We'll continue annual clinical exams with screening lab.  Patient will continue have mammograms at 1 or 2 year intervals.  Will continue colonoscopies at five-year intervals.  Last colonoscopy 2014  15. Provider List Update: Includes primary care medicine OB/GYN ophthalmology and GI     Review of Systems  Constitutional: Negative.   HENT: Negative  for congestion, dental problem, hearing loss, rhinorrhea, sinus pressure, sore throat and tinnitus.   Eyes: Negative for pain, discharge and visual disturbance.  Respiratory: Negative for cough and shortness of breath.   Cardiovascular: Negative for chest pain, palpitations and leg swelling.  Gastrointestinal: Negative for nausea, vomiting, abdominal pain, diarrhea, constipation, blood in stool and abdominal distention.  Genitourinary: Negative for dysuria, urgency, frequency, hematuria, flank pain, vaginal bleeding, vaginal discharge, difficulty urinating, vaginal pain and pelvic pain.  Musculoskeletal: Negative for joint swelling,  arthralgias and gait problem.  Skin: Negative for rash.  Neurological: Negative for dizziness, syncope, speech difficulty, weakness, numbness and headaches.  Hematological: Negative for adenopathy.  Psychiatric/Behavioral: Negative for behavioral problems, dysphoric mood and agitation. The patient is not nervous/anxious.        Objective:   Physical Exam  Constitutional: She is oriented to person, place, and time. She appears well-developed and well-nourished.  HENT:  Head: Normocephalic and atraumatic.  Right Ear: External ear normal.  Left Ear: External ear normal.  Mouth/Throat: Oropharynx is clear and moist.  Eyes: Conjunctivae and EOM are normal.  Neck: Normal range of motion. Neck supple. No JVD present. No thyromegaly present.  Cardiovascular: Normal rate, regular rhythm, normal heart sounds and intact distal pulses.   No murmur heard. Pulmonary/Chest: Effort normal and breath sounds normal. She has no wheezes. She has no rales.  Abdominal: Soft. Bowel sounds are normal. She exhibits no distension and no mass. There is no tenderness. There is no rebound and no guarding.  Musculoskeletal: Normal range of motion. She exhibits no edema or tenderness.  Mild hypertrophic changes left knee  Neurological: She is alert and oriented to person, place, and time. She has normal reflexes. No cranial nerve deficit. She exhibits normal muscle tone. Coordination normal.  Skin: Skin is warm and dry. No rash noted.  Psychiatric: She has a normal mood and affect. Her behavior is normal.          Assessment & Plan:   Preventive health examination Osteoarthritis History of osteopenia.  Continue calcium and vitamin D supplements Glaucoma suspect.  Follow-up ophthalmology  Return here in one year or as needed

## 2015-01-28 NOTE — Patient Instructions (Signed)
Limit your sodium (Salt) intake  Please check your blood pressure on a regular basis.  If it is consistently greater than 140/90, please make an office appointment.    It is important that you exercise regularly, at least 20 minutes 3 to 4 times per week.  If you develop chest pain or shortness of breath seek  medical attention.  Take a calcium supplement, plus [phone] units of vitamin D

## 2015-01-28 NOTE — Progress Notes (Signed)
Pre visit review using our clinic review tool, if applicable. No additional management support is needed unless otherwise documented below in the visit note. 

## 2015-05-27 ENCOUNTER — Ambulatory Visit (INDEPENDENT_AMBULATORY_CARE_PROVIDER_SITE_OTHER): Payer: Medicare Other | Admitting: Internal Medicine

## 2015-05-27 ENCOUNTER — Encounter: Payer: Self-pay | Admitting: Internal Medicine

## 2015-05-27 VITALS — BP 144/80 | HR 76 | Temp 98.1°F | Resp 20 | Ht 61.5 in | Wt 153.0 lb

## 2015-05-27 DIAGNOSIS — J0121 Acute recurrent ethmoidal sinusitis: Secondary | ICD-10-CM

## 2015-05-27 MED ORDER — LORAZEPAM 0.5 MG PO TABS: 0.5000 mg | ORAL_TABLET | Freq: Every day | ORAL | Status: DC | PRN

## 2015-05-27 MED ORDER — AZITHROMYCIN 250 MG PO TABS: ORAL_TABLET | ORAL | Status: DC

## 2015-05-27 MED ORDER — ZOLPIDEM TARTRATE 10 MG PO TABS: ORAL_TABLET | ORAL | Status: DC

## 2015-05-27 NOTE — Progress Notes (Signed)
   Subjective:    Patient ID: [name], female    DOB: [birth year], 70 y.o.   MRN: [id number]  HPI  70 year old patient who has a history of intermittent sinusitis.  She was last treated in the fall of 2014 and initially failed Augmentin.  She was then treated with azithromycin and improved She has been ill for 10-12 days with worsening sinus congestion.  She has developed yellow-green drainage, worsening sinus pressure, pain and low-grade fever.  She has been using Sudafed, Mucinex and Advil as well as Nasonex without benefit  BP Readings from Last 3 Encounters:  05/27/15 144/80  01/28/15 160/90  10/16/12 140/80    Review of Systems  Constitutional: Positive for fever, activity change, appetite change and fatigue.  HENT: Positive for congestion, postnasal drip, rhinorrhea and sinus pressure. Negative for dental problem, hearing loss, sore throat and tinnitus.   Eyes: Negative for pain, discharge and visual disturbance.  Respiratory: Negative for cough and shortness of breath.   Cardiovascular: Negative for chest pain, palpitations and leg swelling.  Gastrointestinal: Negative for nausea, vomiting, abdominal pain, diarrhea, constipation, blood in stool and abdominal distention.  Genitourinary: Negative for dysuria, urgency, frequency, hematuria, flank pain, vaginal bleeding, vaginal discharge, difficulty urinating, vaginal pain and pelvic pain.  Musculoskeletal: Negative for joint swelling, arthralgias and gait problem.  Skin: Negative for rash.  Neurological: Negative for dizziness, syncope, speech difficulty, weakness, numbness and headaches.  Hematological: Negative for adenopathy.  Psychiatric/Behavioral: Negative for behavioral problems, dysphoric mood and agitation. The patient is not nervous/anxious.        Objective:   Physical Exam  Constitutional: She is oriented to person, place, and time. She appears well-developed and well-nourished.  HENT:  Head: Normocephalic.   Right Ear: External ear normal.  Left Ear: External ear normal.  Mouth/Throat: Oropharynx is clear and moist.  Eyes: Conjunctivae and EOM are normal. Pupils are equal, round, and reactive to light.  Neck: Normal range of motion. Neck supple. No thyromegaly present.  Cardiovascular: Normal rate, regular rhythm, normal heart sounds and intact distal pulses.   Pulmonary/Chest: Effort normal and breath sounds normal.  Abdominal: Soft. Bowel sounds are normal. She exhibits no mass. There is no tenderness.  Musculoskeletal: Normal range of motion.  Lymphadenopathy:    She has no cervical adenopathy.  Neurological: She is alert and oriented to person, place, and time.  Skin: Skin is warm and dry. No rash noted.  Psychiatric: She has a normal mood and affect. Her behavior is normal.          Assessment & Plan:   Acute sinusitis.  Patient did not respond well to Augmentin in 2014 but did well with azithromycin.  Will retreat with azithromycin  Continue low-salt diet and home blood pressure monitoring

## 2015-05-27 NOTE — Progress Notes (Signed)
Pre visit review using our clinic review tool, if applicable. No additional management support is needed unless otherwise documented below in the visit note. 

## 2015-05-27 NOTE — Patient Instructions (Signed)
HOME CARE INSTRUCTIONS  Drink plenty of water. Water helps thin the mucus so your sinuses can drain more easily.  Use a humidifier.  Inhale steam 3-4 times a day (for example, sit in the bathroom with the shower running).  Apply a warm, moist washcloth to your face 3-4 times a day, or as directed by your health care provider.  Use saline nasal sprays to help moisten and clean your sinuses.  Take medicines only as directed by your health care provider.  If you were prescribed either an antibiotic or antifungal medicine, finish it all even if you start to feel better.   Please check your blood pressure on a regular basis.  If it is consistently greater than 140/90, please make an office appointment, please call or make an office appointment

## 2015-06-02 DIAGNOSIS — L820 Inflamed seborrheic keratosis: Secondary | ICD-10-CM | POA: Diagnosis not present

## 2015-06-02 DIAGNOSIS — L814 Other melanin hyperpigmentation: Secondary | ICD-10-CM | POA: Diagnosis not present

## 2015-06-02 DIAGNOSIS — D235 Other benign neoplasm of skin of trunk: Secondary | ICD-10-CM | POA: Diagnosis not present

## 2015-06-02 DIAGNOSIS — L821 Other seborrheic keratosis: Secondary | ICD-10-CM | POA: Diagnosis not present

## 2015-06-19 DIAGNOSIS — M1712 Unilateral primary osteoarthritis, left knee: Secondary | ICD-10-CM | POA: Diagnosis not present

## 2015-08-12 DIAGNOSIS — M810 Age-related osteoporosis without current pathological fracture: Secondary | ICD-10-CM | POA: Diagnosis not present

## 2015-08-12 LAB — HM DEXA SCAN

## 2015-09-09 ENCOUNTER — Other Ambulatory Visit: Payer: Self-pay

## 2015-10-07 DIAGNOSIS — H40013 Open angle with borderline findings, low risk, bilateral: Secondary | ICD-10-CM | POA: Diagnosis not present

## 2015-10-07 DIAGNOSIS — H2513 Age-related nuclear cataract, bilateral: Secondary | ICD-10-CM | POA: Diagnosis not present

## 2015-10-09 ENCOUNTER — Encounter: Payer: Self-pay | Admitting: Internal Medicine

## 2015-10-21 DIAGNOSIS — Z23 Encounter for immunization: Secondary | ICD-10-CM | POA: Diagnosis not present

## 2015-10-27 DIAGNOSIS — Z1231 Encounter for screening mammogram for malignant neoplasm of breast: Secondary | ICD-10-CM | POA: Diagnosis not present

## 2015-10-27 LAB — HM MAMMOGRAPHY

## 2015-10-30 ENCOUNTER — Encounter: Payer: Self-pay | Admitting: Internal Medicine

## 2016-02-12 DIAGNOSIS — M1712 Unilateral primary osteoarthritis, left knee: Secondary | ICD-10-CM | POA: Diagnosis not present

## 2016-03-17 ENCOUNTER — Ambulatory Visit (INDEPENDENT_AMBULATORY_CARE_PROVIDER_SITE_OTHER): Payer: Medicare Other | Admitting: Internal Medicine

## 2016-03-17 ENCOUNTER — Encounter: Payer: Self-pay | Admitting: Internal Medicine

## 2016-03-17 VITALS — BP 122/78 | HR 78 | Temp 98.1°F | Ht 61.5 in | Wt 156.0 lb

## 2016-03-17 DIAGNOSIS — G4709 Other insomnia: Secondary | ICD-10-CM | POA: Diagnosis not present

## 2016-03-17 DIAGNOSIS — Z0000 Encounter for general adult medical examination without abnormal findings: Secondary | ICD-10-CM

## 2016-03-17 DIAGNOSIS — Z23 Encounter for immunization: Secondary | ICD-10-CM

## 2016-03-17 LAB — COMPREHENSIVE METABOLIC PANEL
ALT: 14 U/L (ref 0–35)
AST: 17 U/L (ref 0–37)
Albumin: 4.4 g/dL (ref 3.5–5.2)
Alkaline Phosphatase: 64 U/L (ref 39–117)
BILIRUBIN TOTAL: 0.7 mg/dL (ref 0.2–1.2)
BUN: 17 mg/dL (ref 6–23)
CALCIUM: 9.8 mg/dL (ref 8.4–10.5)
CO2: 29 meq/L (ref 19–32)
CREATININE: 0.73 mg/dL (ref 0.40–1.20)
Chloride: 104 mEq/L (ref 96–112)
GFR: 83.86 mL/min (ref >60.00)
Glucose, Bld: 89 mg/dL (ref 70–99)
Potassium: 5.5 mEq/L — ABNORMAL HIGH (ref 3.5–5.1)
SODIUM: 135 meq/L (ref 135–145)
Total Protein: 6.8 g/dL (ref 6.0–8.3)

## 2016-03-17 LAB — CBC WITH DIFFERENTIAL/PLATELET
BASOS ABS: 0.1 10*3/uL (ref 0.0–0.1)
Basophils Relative: 1.2 % (ref 0.0–3.0)
EOS ABS: 0.1 10*3/uL (ref 0.0–0.7)
Eosinophils Relative: 2.5 % (ref 0.0–5.0)
HEMATOCRIT: 43.3 % (ref 36.0–46.0)
Hemoglobin: 14.7 g/dL (ref 12.0–15.0)
LYMPHS ABS: 2.3 10*3/uL (ref 0.7–4.0)
LYMPHS PCT: 44 % (ref 12.0–46.0)
MCHC: 33.9 g/dL (ref 30.0–36.0)
MCV: 98.7 fl (ref 78.0–100.0)
MONO ABS: 0.5 10*3/uL (ref 0.1–1.0)
Monocytes Relative: 9.4 % (ref 3.0–12.0)
NEUTROS ABS: 2.2 10*3/uL (ref 1.4–7.7)
NEUTROS PCT: 42.9 % — AB (ref 43.0–77.0)
PLATELETS: 311 10*3/uL (ref 150.0–400.0)
RBC: 4.39 Mil/uL (ref 3.87–5.11)
RDW: 12.6 % (ref 11.5–15.5)
WBC: 5.1 10*3/uL (ref 4.0–10.5)

## 2016-03-17 LAB — LIPID PANEL
CHOL/HDL RATIO: 3
CHOLESTEROL: 205 mg/dL — AB (ref 0–200)
HDL: 61.8 mg/dL (ref >39.00)
LDL Cholesterol: 120 mg/dL — ABNORMAL HIGH (ref 0–99)
NonHDL: 143.59
TRIGLYCERIDES: 120 mg/dL (ref 0.0–149.0)
VLDL: 24 mg/dL (ref 0.0–40.0)

## 2016-03-17 LAB — TSH: TSH: 4.64 u[IU]/mL — ABNORMAL HIGH (ref 0.35–4.50)

## 2016-03-17 MED ORDER — LORAZEPAM 0.5 MG PO TABS: 0.5000 mg | ORAL_TABLET | Freq: Every day | ORAL | 2 refills | Status: DC | PRN

## 2016-03-17 MED ORDER — ZOLPIDEM TARTRATE 10 MG PO TABS: ORAL_TABLET | ORAL | 2 refills | Status: DC

## 2016-03-17 NOTE — Progress Notes (Signed)
Pre visit review using our clinic review tool, if applicable. No additional management support is needed unless otherwise documented below in the visit note. 

## 2016-03-17 NOTE — Addendum Note (Signed)
Addended by: Abelardo Diesel on: 03/17/2016 10:10 AM   Modules accepted: Orders

## 2016-03-17 NOTE — Progress Notes (Signed)
Subjective:    Patient ID: [name], female    DOB: [birth year], 71 y.o.   MRN: [id number]  HPI  71 year old patient who is seen today in follow-up. She has a history of occasional insomnia and is requesting a refill on Ambien. Other complaints include hair loss.  She is concerned about possible thyroid dysfunction.  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 Osteopenia      Social History   Social History  . Marital status: Married    Spouse name: N/A  . Number of children: N/A  . Years of education: N/A   Occupational History  . Not on file.   Social History Main Topics  . Smoking status: Former Smoker    Types: Cigarettes    Quit date: 03/12/2011  . Smokeless tobacco: Never Used  . Alcohol use Yes  . Drug use: No  . Sexual activity: Not on file   Other Topics Concern  . Not on file   Social History Narrative  . No narrative on file    No past surgical history on file.  Family History  Problem Relation Age of Onset  . Heart disease Mother   . Arthritis Mother   . Hypertension Father   . Arthritis Father     No Known Allergies  Current Outpatient Prescriptions on File Prior to Visit  Medication Sig Dispense Refill  . Multiple Vitamin (MULTIVITAMIN) tablet Take 1 tablet by mouth daily.    . Omega-3 Fatty Acids (FISH OIL) 1200 MG CAPS Take 1 capsule by mouth daily.    Vladimir Faster Glycol-Propyl Glycol (SYSTANE) 0.4-0.3 % SOLN Apply 1 drop to eye 3 (three) times daily.    . vitamin C (ASCORBIC ACID) 500 MG tablet Take 500 mg by mouth daily.    . vitamin E 400 UNIT capsule Take 400 Units by mouth daily.     No current  facility-administered medications on file prior to visit.     BP 122/78 (BP Location: Left Arm, Patient Position: Sitting, Cuff Size: Normal)   Pulse 78   Temp 98.1 F (36.7 C) (Oral)   Ht 5' 1.5" (1.562 m)   Wt 156 lb (70.8 kg)   SpO2 99%   BMI 29.00 kg/m     Review of Systems  Constitutional: Negative.   HENT: Negative for congestion, dental problem, hearing loss, rhinorrhea, sinus pressure, sore throat and tinnitus.   Eyes: Negative for pain, discharge and visual disturbance.  Respiratory: Negative for cough and shortness of breath.   Cardiovascular: Negative for chest pain, palpitations and leg swelling.  Gastrointestinal: Negative for abdominal distention, abdominal pain, blood in stool, constipation, diarrhea, nausea and vomiting.  Genitourinary: Negative for difficulty urinating, dysuria, flank pain, frequency, hematuria, pelvic pain, urgency, vaginal bleeding, vaginal discharge and vaginal pain.  Musculoskeletal: Negative for arthralgias, gait problem and joint swelling.  Skin: Negative for rash.  Neurological: Negative for dizziness, syncope, speech difficulty, weakness, numbness and headaches.  Hematological: Negative for adenopathy.  Psychiatric/Behavioral: Positive for sleep disturbance. Negative for agitation, behavioral problems and dysphoric mood. The patient is nervous/anxious.        Objective:   Physical Exam  Constitutional: She is oriented  to person, place, and time. She appears well-developed and well-nourished.  Blood pressure 110/76  HENT:  Head: Normocephalic.  Right Ear: External ear normal.  Left Ear: External ear normal.  Mouth/Throat: Oropharynx is clear and moist.  Eyes: Conjunctivae and EOM are normal. Pupils are equal, round, and reactive to light.  Neck: Normal range of motion. Neck supple. No thyromegaly present.  Cardiovascular: Normal rate, regular rhythm, normal heart sounds and intact distal pulses.   Pulmonary/Chest: Effort normal and  breath sounds normal.  Abdominal: Soft. Bowel sounds are normal. She exhibits no mass. There is no tenderness.  Musculoskeletal: Normal range of motion.  Lymphadenopathy:    She has no cervical adenopathy.  Neurological: She is alert and oriented to person, place, and time.  Skin: Skin is warm and dry. No rash noted.  Psychiatric: She has a normal mood and affect. Her behavior is normal.          Assessment & Plan:   Preventive health.  We'll check screening lab Thinning of the hair.  Will check a thyroid indices Family history of thyroid disorder Situation insomnia.  Ambien refilled  Continue annual mammograms Continue vitamin D and calcium supplementation  Recheck one year or as needed  [employer]

## 2016-03-17 NOTE — Patient Instructions (Signed)
It is important that you exercise regularly, at least 20 minutes 3 to 4 times per week.  If you develop chest pain or shortness of breath seek  medical attention.  Take a calcium supplement, plus 800-1200 units of vitamin D  Return in one year for follow-up   

## 2016-05-25 ENCOUNTER — Telehealth: Payer: Self-pay

## 2016-05-25 NOTE — Telephone Encounter (Signed)
PA approved, form faxed back to pharmacy. 

## 2016-05-25 NOTE — Telephone Encounter (Signed)
Received PA request from [facility] for Zolpidem 10 mg tablet. PA submitted & is pending. Key: [id number]

## 2016-06-16 ENCOUNTER — Telehealth: Payer: Self-pay

## 2016-06-16 ENCOUNTER — Ambulatory Visit: Payer: Medicare Other

## 2016-06-16 VITALS — BP 132/70 | HR 70 | Ht 62.0 in | Wt 155.0 lb

## 2016-06-16 DIAGNOSIS — Z1159 Encounter for screening for other viral diseases: Secondary | ICD-10-CM

## 2016-06-16 DIAGNOSIS — G4700 Insomnia, unspecified: Secondary | ICD-10-CM

## 2016-06-16 DIAGNOSIS — Z0000 Encounter for general adult medical examination without abnormal findings: Secondary | ICD-10-CM

## 2016-06-16 DIAGNOSIS — R7989 Other specified abnormal findings of blood chemistry: Secondary | ICD-10-CM

## 2016-06-16 NOTE — Patient Instructions (Addendum)
[name] , Thank you for taking time to come for your Medicare Wellness Visit. I appreciate your ongoing commitment to your health goals. Please review the following plan we discussed and let me know if I can assist you in the future.   Insomnia; say Goodnight to insomnia   May repeat dexa in a year; 09/2017  If dr. Raliegh Ip thinks it's necessary  Manuela Schwartz will call and get your mammogram report for [facility]   Colonoscopy repeat in 2019; screens updated   May consider GYN / pelvic; no pap needed   You can have a hearing screen anywhere you like; this is covered by Medicare      These are the goals we discussed: Goals    . Weight (lb) < 145 lb (65.8 kg)          Check out  online nutrition programs as GumSearch.nl and http://vang.com/; fit16m; Can use lose it  May want to keep a diary for approx 1 month; to see if weight is responds to calorie reduction You can use calorieking.com  Look for foods with "whole" wheat; bran; oatmeal etc Shot at the farmer's markets in season for fresher choices  Watch for "hydrogenated" on the label of oils which are trans-fats.  Watch for "high fructose corn syrup" in snacks, yogurt or ketchup  Meats have less marbling; bright colored fruits and vegetables;  Canned; dump out liquid and wash vegetables. Be mindful of what we are eating  Portion control is essential to a health weight! Sit down; take a break and enjoy your meal; take smaller bites; put the fork down between bites;  It takes 20 minutes to get full; so check in with your fullness cues and stop eating when you start to fill full              This is a list of the screening recommended for you and due dates:  Health Maintenance  Topic Date Due  .  Hepatitis C: One time screening is recommended by Center for Disease Control  (CDC) for  adults born from [birth year]through 1964.   [birth year] . Flu Shot  08/11/2016  . Mammogram  10/26/2016  . Colon Cancer Screening  11/24/2017  . Tetanus  Vaccine  06/12/2022  . DEXA scan (bone density measurement)  Completed  . Pneumonia vaccines  Completed   Prevention of falls: Remove rugs or any tripping hazards in the home Use Non slip mats in bathtubs and showers Placing grab bars next to the toilet and or shower Placing handrails on both sides of the stair way Adding extra lighting in the home.   Personal safety issues reviewed:  1. Consider starting a community watch program [facility].  Changes batteries is smoke detector and/or carbon monoxide detector  3.  If you have firearms; keep them in a safe place 4.  Wear protection when in the sun; Always wear sunscreen or a hat; It is good to have your doctor check your skin annually or review any new areas of concern 5. Driving safety; Keep in the right lane; stay 3 car lengths behind the car in front of you on the highway; look 3 times prior to pulling out; carry your cell phone everywhere you go!    Learn about the Yellow Dot program:  The program allows first responders at your emergency to have access to who your physician is, as well as your medications and medical conditions.  Citizens requesting the Yellow Dot  Packages should contact Master Corporal Nunzio Cobbs at the [facility] [phone] for the first week of the program and beginning the week after Easter citizens should contact their Scientist, physiological.   Health Maintenance, Female Adopting a healthy lifestyle and getting preventive care can go a long way to promote health and wellness. Talk with your health care provider about what schedule of regular examinations is right for you. This is a good chance for you to check in with your provider about disease prevention and staying healthy. In between checkups, there are plenty of things you can do on your own. Experts have done a lot of research about which lifestyle changes and preventive measures are most likely to keep you  healthy. Ask your health care provider for more information. Weight and diet Eat a healthy diet  Be sure to include plenty of vegetables, fruits, low-fat dairy products, and lean protein.  Do not eat a lot of foods high in solid fats, added sugars, or salt.  Get regular exercise. This is one of the most important things you can do for your health. ? Most adults should exercise for at least 150 minutes each week. The exercise should increase your heart rate and make you sweat (moderate-intensity exercise). ? Most adults should also do strengthening exercises at least twice a week. This is in addition to the moderate-intensity exercise.  Maintain a healthy weight  Body mass index (BMI) is a measurement that can be used to identify possible weight problems. It estimates body fat based on height and weight. Your health care provider can help determine your BMI and help you achieve or maintain a healthy weight.  For females 58 years of age and older: ? A BMI below 18.5 is considered underweight. ? A BMI of 18.5 to 24.9 is normal. ? A BMI of 25 to 29.9 is considered overweight. ? A BMI of 30 and above is considered obese.  Watch levels of cholesterol and blood lipids  You should start having your blood tested for lipids and cholesterol at 71 years of age, then have this test every 5 years.  You may need to have your cholesterol levels checked more often if: ? Your lipid or cholesterol levels are high. ? You are older than 70 years of age. ? You are at high risk for heart disease.  Cancer screening Lung Cancer  Lung cancer screening is recommended for adults 34-4 years old who are at high risk for lung cancer because of a history of smoking.  A yearly low-dose CT scan of the lungs is recommended for people who: ? Currently smoke. ? Have quit within the past 15 years. ? Have at least a 30-pack-year history of smoking. A pack year is smoking an average of one pack of cigarettes a day  for 1 year.  Yearly screening should continue until it has been 15 years since you quit.  Yearly screening should stop if you develop a health problem that would prevent you from having lung cancer treatment.  Breast Cancer  Practice breast self-awareness. This means understanding how your breasts normally appear and feel.  It also means doing regular breast self-exams. Let your health care provider know about any changes, no matter how small.  If you are in your 20s or 30s, you should have a clinical breast exam (CBE) by a health care provider every 1-3 years as part of a regular health exam.  If you are 84 or older, have a  CBE every year. Also consider having a breast X-ray (mammogram) every year.  If you have a family history of breast cancer, talk to your health care provider about genetic screening.  If you are at high risk for breast cancer, talk to your health care provider about having an MRI and a mammogram every year.  Breast cancer gene (BRCA) assessment is recommended for women who have family members with BRCA-related cancers. BRCA-related cancers include: ? Breast. ? Ovarian. ? Tubal. ? Peritoneal cancers.  Results of the assessment will determine the need for genetic counseling and BRCA1 and BRCA2 testing.  Cervical Cancer Your health care provider may recommend that you be screened regularly for cancer of the pelvic organs (ovaries, uterus, and vagina). This screening involves a pelvic examination, including checking for microscopic changes to the surface of your cervix (Pap test). You may be encouraged to have this screening done every 3 years, beginning at age 27.  For women ages 61-65, health care providers may recommend pelvic exams and Pap testing every 3 years, or they may recommend the Pap and pelvic exam, combined with testing for human papilloma virus (HPV), every 5 years. Some types of HPV increase your risk of cervical cancer. Testing for HPV may also be done  on women of any age with unclear Pap test results.  Other health care providers may not recommend any screening for nonpregnant women who are considered low risk for pelvic cancer and who do not have symptoms. Ask your health care provider if a screening pelvic exam is right for you.  If you have had past treatment for cervical cancer or a condition that could lead to cancer, you need Pap tests and screening for cancer for at least 20 years after your treatment. If Pap tests have been discontinued, your risk factors (such as having a new sexual partner) need to be reassessed to determine if screening should resume. Some women have medical problems that increase the chance of getting cervical cancer. In these cases, your health care provider may recommend more frequent screening and Pap tests.  Colorectal Cancer  This type of cancer can be detected and often prevented.  Routine colorectal cancer screening usually begins at 70 years of age and continues through 70 years of age.  Your health care provider may recommend screening at an earlier age if you have risk factors for colon cancer.  Your health care provider may also recommend using home test kits to check for hidden blood in the stool.  A small camera at the end of a tube can be used to examine your colon directly (sigmoidoscopy or colonoscopy). This is done to check for the earliest forms of colorectal cancer.  Routine screening usually begins at age 39.  Direct examination of the colon should be repeated every 5-10 years through 70 years of age. However, you may need to be screened more often if early forms of precancerous polyps or small growths are found.  Skin Cancer  Check your skin from head to toe regularly.  Tell your health care provider about any new moles or changes in moles, especially if there is a change in a mole's shape or color.  Also tell your health care provider if you have a mole that is larger than the size of  a pencil eraser.  Always use sunscreen. Apply sunscreen liberally and repeatedly throughout the day.  Protect yourself by wearing long sleeves, pants, a wide-brimmed hat, and sunglasses whenever you are outside.  Heart disease,  diabetes, and high blood pressure  High blood pressure causes heart disease and increases the risk of stroke. High blood pressure is more likely to develop in: ? People who have blood pressure in the high end of the normal range (130-139/85-89 mm Hg). ? People who are overweight or obese. ? People who are African American.  If you are 19-18 years of age, have your blood pressure checked every 3-5 years. If you are 48 years of age or older, have your blood pressure checked every year. You should have your blood pressure measured twice-once when you are at a hospital or clinic, and once when you are not at a hospital or clinic. Record the average of the two measurements. To check your blood pressure when you are not at a hospital or clinic, you can use: ? An automated blood pressure machine at a pharmacy. ? A home blood pressure monitor.  If you are between 5 years and 36 years old, ask your health care provider if you should take aspirin to prevent strokes.  Have regular diabetes screenings. This involves taking a blood sample to check your fasting blood sugar level. ? If you are at a normal weight and have a low risk for diabetes, have this test once every three years after 70 years of age. ? If you are overweight and have a high risk for diabetes, consider being tested at a younger age or more often. Preventing infection Hepatitis B  If you have a higher risk for hepatitis B, you should be screened for this virus. You are considered at high risk for hepatitis B if: ? You were born in a country where hepatitis B is common. Ask your health care provider which countries are considered high risk. ? Your parents were born in a high-risk country, and you have not been  immunized against hepatitis B (hepatitis B vaccine). ? You have HIV or AIDS. ? You use needles to inject street drugs. ? You live with someone who has hepatitis B. ? You have had sex with someone who has hepatitis B. ? You get hemodialysis treatment. ? You take certain medicines for conditions, including cancer, organ transplantation, and autoimmune conditions.  Hepatitis C  Blood testing is recommended for: ? Everyone born from [birth year] through 1964. ? Anyone with known risk factors for hepatitis C.  Sexually transmitted infections (STIs)  You should be screened for sexually transmitted infections (STIs) including gonorrhea and chlamydia if: ? You are sexually active and are younger than 70 years of age. ? You are older than 70 years of age and your health care provider tells you that you are at risk for this type of infection. ? Your sexual activity has changed since you were last screened and you are at an increased risk for chlamydia or gonorrhea. Ask your health care provider if you are at risk.  If you do not have HIV, but are at risk, it may be recommended that you take a prescription medicine daily to prevent HIV infection. This is called pre-exposure prophylaxis (PrEP). You are considered at risk if: ? You are sexually active and do not regularly use condoms or know the HIV status of your partner(s). ? You take drugs by injection. ? You are sexually active with a partner who has HIV.  Talk with your health care provider about whether you are at high risk of being infected with HIV. If you choose to begin PrEP, you should first be tested for HIV. You should then  be tested every 3 months for as long as you are taking PrEP. Pregnancy  If you are premenopausal and you may become pregnant, ask your health care provider about preconception counseling.  If you may become pregnant, take 400 to 800 micrograms (mcg) of folic acid every day.  If you want to prevent pregnancy, talk to your  health care provider about birth control (contraception). Osteoporosis and menopause  Osteoporosis is a disease in which the bones lose minerals and strength with aging. This can result in serious bone fractures. Your risk for osteoporosis can be identified using a bone density scan.  If you are 76 years of age or older, or if you are at risk for osteoporosis and fractures, ask your health care provider if you should be screened.  Ask your health care provider whether you should take a calcium or vitamin D supplement to lower your risk for osteoporosis.  Menopause may have certain physical symptoms and risks.  Hormone replacement therapy may reduce some of these symptoms and risks. Talk to your health care provider about whether hormone replacement therapy is right for you. Follow these instructions at home:  Schedule regular health, dental, and eye exams.  Stay current with your immunizations.  Do not use any tobacco products including cigarettes, chewing tobacco, or electronic cigarettes.  If you are pregnant, do not drink alcohol.  If you are breastfeeding, limit how much and how often you drink alcohol.  Limit alcohol intake to no more than 1 drink per day for nonpregnant women. One drink equals 12 ounces of beer, 5 ounces of wine, or 1 ounces of hard liquor.  Do not use street drugs.  Do not share needles.  Ask your health care provider for help if you need support or information about quitting drugs.  Tell your health care provider if you often feel depressed.  Tell your health care provider if you have ever been abused or do not feel safe at home. This information is not intended to replace advice given to you by your health care provider. Make sure you discuss any questions you have with your health care provider. Document Released: 07/13/2010 Document Revised: 06/05/2015 Document Reviewed: 10/01/2014 Elsevier Interactive Patient Education  2018 [place] in the Home Falls can cause injuries and can affect people from all age groups. There are many simple things that you can do to make your home safe and to help prevent falls. What can I do on the outside of my home?  Regularly repair the edges of walkways and driveways and fix any cracks.  Remove high doorway thresholds.  Trim any shrubbery on the main path into your home.  Use bright outdoor lighting.  Clear walkways of debris and clutter, including tools and rocks.  Regularly check that handrails are securely fastened and in good repair. Both sides of any steps should have handrails.  Install guardrails along the edges of any raised decks or porches.  Have leaves, snow, and ice cleared regularly.  Use sand or salt on walkways during winter months.  In the garage, clean up any spills right away, including grease or oil spills. What can I do in the bathroom?  Use night lights.  Install grab bars by the toilet and in the tub and shower. Do not use towel bars as grab bars.  Use non-skid mats or decals on the floor of the tub or shower.  If you need to sit down while you are  in the shower, use a plastic, non-slip stool.  Keep the floor dry. Immediately clean up any water that spills on the floor.  Remove soap buildup in the tub or shower on a regular basis.  Attach bath mats securely with double-sided non-slip rug tape.  Remove throw rugs and other tripping hazards from the floor. What can I do in the bedroom?  Use night lights.  Make sure that a bedside light is easy to reach.  Do not use oversized bedding that drapes onto the floor.  Have a firm chair that has side arms to use for getting dressed.  Remove throw rugs and other tripping hazards from the floor. What can I do in the kitchen?  Clean up any spills right away.  Avoid walking on wet floors.  Place frequently used items in easy-to-reach places.  If you need to reach for  something above you, use a sturdy step stool that has a grab bar.  Keep electrical cables out of the way.  Do not use floor polish or wax that makes floors slippery. If you have to use wax, make sure that it is non-skid floor wax.  Remove throw rugs and other tripping hazards from the floor. What can I do in the stairways?  Do not leave any items on the stairs.  Make sure that there are handrails on both sides of the stairs. Fix handrails that are broken or loose. Make sure that handrails are as long as the stairways.  Check any carpeting to make sure that it is firmly attached to the stairs. Fix any carpet that is loose or worn.  Avoid having throw rugs at the top or bottom of stairways, or secure the rugs with carpet tape to prevent them from moving.  Make sure that you have a light switch at the top of the stairs and the bottom of the stairs. If you do not have them, have them installed. What are some other fall prevention tips?  Wear closed-toe shoes that fit well and support your feet. Wear shoes that have rubber soles or low heels.  When you use a stepladder, make sure that it is completely opened and that the sides are firmly locked. Have someone hold the ladder while you are using it. Do not climb a closed stepladder.  Add color or contrast paint or tape to grab bars and handrails in your home. Place contrasting color strips on the first and last steps.  Use mobility aids as needed, such as canes, walkers, scooters, and crutches.  Turn on lights if it is dark. Replace any light bulbs that burn out.  Set up furniture so that there are clear paths. Keep the furniture in the same spot.  Fix any uneven floor surfaces.  Choose a carpet design that does not hide the edge of steps of a stairway.  Be aware of any and all pets.  Review your medicines with your healthcare provider. Some medicines can cause dizziness or changes in blood pressure, which increase your risk of  falling. Talk with your health care provider about other ways that you can decrease your risk of falls. This may include working with a physical therapist or trainer to improve your strength, balance, and endurance. This information is not intended to replace advice given to you by your health care provider. Make sure you discuss any questions you have with your health care provider. Document Released: 12/18/2001 Document Revised: 05/27/2015 Document Reviewed: 02/01/2014 Elsevier Interactive Patient Education  2017 [employer]

## 2016-06-16 NOTE — Progress Notes (Signed)
I have reviewed and agree with note, evaluation, plan. I agree a follow up with Dr. Raliegh Ip is advisable to discuss TSH and insomnia concerns.   Garret Reddish, MD

## 2016-06-16 NOTE — Progress Notes (Signed)
Subjective:   [name] is a 71 y.o. female who presents for Medicare Annual (Subsequent) preventive examination.  The Patient was informed that the wellness visit is to identify future health risk and educate and initiate measures that can reduce risk for increased disease through the lifespan.    NO ROS; Medicare Wellness Visit  Describes health as good, fair or great? 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 She will consider Can make apt with Dr. Raliegh Ip to [place] due 10/2015; due 10/2016    Smoking history 15 pack years  Second Hand Smoke status; No Smokers in the home ETOH - 2 glasses of wine in the evening  Medication adherence or issues?  no  RISK FACTORS Diet -  Tries to eat as many fruits and veg Watches carbs  Diet had not changed but as slow gained weight since she was 61   Regular exercise  Exercise x 5 days a week at home;  Arm weight; leg lifts x 60 minutes  Does her own house work [employer] work Never just sits around; stays active  Cardiac Risk Factors:  Advanced aged  >65 in women Hyperlipidemia HDL 61; Trig 120  Diabetes - Family History - HD and OA  Obesity - BMI 28.4   Fall risk  neg Given education on "Fall Prevention in the Home" for more safety tips the patient can apply as appropriate.    Mobility of Functional changes this year? no   Mental Health:  Any emotional problems? Anxious, depressed, irritable, sad  or blue? no Denies feeling depressed or hopeless; voices pleasure in daily life How many social activities have you been engaged in within the last 2 weeks? Who would help you with chores; illness; shopping other? No  Hearing Screening Comments: Haring issues Some hearing loss  Vision Screening Comments: Vision check q year Dr. Leanne Lovely Dr Cena Benton  Eye asso Checking for glaucoma but is negative     Activities of Daily Living - See functional screen   Cognitive testing; Ad8 score; 0 or less than 2  MMSE deferred or completed if AD8 + 2 issues  Advanced Directives completed   Patient Care Team: Marletta Lor, MD as PCP - General Went to Dr. Ulanda Edison; still has uterus and ovaries  May consider continuing for pelvic exams  Immunization History  Administered Date(s) Administered  . Influenza Whole 11/24/2009  . Influenza, High Dose Seasonal PF 10/23/2014, 10/21/2015  . Pneumococcal Conjugate-13 10/23/2014  . Pneumococcal Polysaccharide-23 03/17/2016  . Tdap 06/11/2012   Required Immunizations needed today  Screening test up to date or reviewed for plan of completion Health Maintenance Due  Topic Date Due  . Hepatitis C Screening  [birth year]  agreed to order and will obtain at the next blood draw  Cardiac Risk Factors include: advanced age (>41men, >31 women)     Objective:     Vitals: BP 132/70   Pulse  70   Ht 5\' 2"  (1.575 m)   Wt 155 lb (70.3 kg)   SpO2 98%   BMI 28.35 kg/m   Body mass index is 28.35 kg/m.   Tobacco History  Smoking Status  . Former Smoker  . Packs/day: 0.50  . Years: 30.00  . Types: Cigarettes  . Quit date: 03/12/2011  Smokeless Tobacco  . Never Used     Counseling given: Yes   Past Medical History:  Diagnosis Date  . Osteopenia    No past surgical history on file. Family History  Problem Relation Age of Onset  . Heart disease Mother   . Arthritis Mother   . Hypertension Father   . Arthritis Father    History  Sexual  Activity  . Sexual activity: Not on file    Outpatient Encounter Prescriptions as of 06/16/2016  Medication Sig  . LORazepam (ATIVAN) 0.5 MG tablet Take 1 tablet (0.5 mg total) by mouth daily as needed.  . Multiple Vitamin (MULTIVITAMIN) tablet Take 1 tablet by mouth daily.  . Omega-3 Fatty Acids (FISH OIL) 1200 MG CAPS Take 1 capsule by mouth daily.  Vladimir Faster Glycol-Propyl Glycol (SYSTANE) 0.4-0.3 % SOLN Apply 1 drop to eye 3 (three) times daily.  . vitamin C (ASCORBIC ACID) 500 MG tablet Take 500 mg by mouth daily.  . vitamin E 400 UNIT capsule Take 400 Units by mouth daily.  Marland Kitchen zolpidem (AMBIEN) 10 MG tablet TAKE 1 TABLET AT BEDTIME AS NEEDED FOR SLEEP   No facility-administered encounter medications on file as of 06/16/2016.     Activities of Daily Living In your present state of health, do you have any difficulty performing the following activities: 06/16/2016  Hearing? N  Vision? N  Difficulty concentrating or making decisions? N  Walking or climbing stairs? N  Dressing or bathing? N  Doing errands, shopping? N  Preparing Food and eating ? N  Using the Toilet? N  In the past six months, have you accidently leaked urine? N  Do you have problems with loss of bowel control? N  Managing your Medications? N  Managing your Finances? N  Housekeeping or managing your Housekeeping? N  Some recent data might be hidden    Patient Care Team: Marletta Lor, MD as PCP - General    Assessment:     Exercise Activities and Dietary recommendations Current Exercise Habits: Home exercise routine, Time (Minutes): 60, Frequency (Times/Week): 5, Weekly Exercise (Minutes/Week): 300, Intensity: Moderate  Goals    . Weight (lb) < 145 lb (65.8 kg)          Check out  online nutrition programs as GumSearch.nl and http://vang.com/; fit27me; Can use lose it  May want to keep a diary for approx 1 month; to see if weight is responds to calorie reduction You can use  calorieking.com  Look for foods with "whole" wheat; bran; oatmeal etc Shot at the farmer's markets in season for fresher choices  Watch for "hydrogenated" on the label of oils which are trans-fats.  Watch for "high fructose corn syrup" in snacks, yogurt or ketchup  Meats have less marbling; bright colored fruits and vegetables;  Canned; dump out liquid and wash vegetables. Be mindful of what we are eating  Portion control is essential to a health weight! Sit down; take a break and enjoy your meal; take smaller bites; put the fork down between bites;  It takes 20 minutes to get full; so check in with your fullness cues and stop  eating when you start to fill full             Fall Risk Fall Risk  06/16/2016 03/17/2016 09/09/2015  Falls in the past year? No No No   Depression Screen PHQ 2/9 Scores 06/16/2016 03/17/2016  PHQ - 2 Score 0 0     Cognitive Function MMSE - Mini Mental State Exam 06/16/2016  Not completed: (No Data)        Immunization History  Administered Date(s) Administered  . Influenza Whole 11/24/2009  . Influenza, High Dose Seasonal PF 10/23/2014, 10/21/2015  . Pneumococcal Conjugate-13 10/23/2014  . Pneumococcal Polysaccharide-23 03/17/2016  . Tdap 06/11/2012   Screening Tests Health Maintenance  Topic Date Due  . Hepatitis C Screening  19-Jan-1945  . INFLUENZA VACCINE  08/11/2016  . MAMMOGRAM  10/26/2016  . COLONOSCOPY  11/24/2017  . TETANUS/TDAP  06/12/2022  . DEXA SCAN  Completed  . PNA vac Low Risk Adult  Completed      Plan:     PCP Notes  Health Maintenance Hep c to be done at the next blood draw May repeat dexa next year; due to smoking hx; lowest score -0.90 femur  Colonoscopy due to be repeated  2019  Abnormal Screens  Feels she may have slight hearing issues but it is not problematic. Can go to audiology clinic of her preference for hearing test  Referrals none at this time  Patient concerns; Request copy of lab work  Can't sleep  well- discussed options  Is concerned insomnia is related  to her thyroid Losing hair; does not have to shave legs;  Weight gain with no diet changes Cold sensitivity  Low metabolism as much exercise as she gets  Requested TSH and labs; 4.64 (slightly elevated)   Gets 3 to 4 hours on average Mentioned stopping etoh at hs to see if this would help Also mentioned documenting food intake and counting calories to get some more information on weight concerns Can schedule apt with Dr. Raliegh Ip to discuss or order T3/T4   Nurse Concerns; patient is an Therapist, sports; worked Scientist, water quality  Next PCP apt - TBS as needed     I have personally reviewed and noted the following in the patient's chart:   . Medical and social history . Use of alcohol, tobacco or illicit drugs  . Current medications and supplements . Functional ability and status . Nutritional status . Physical activity . Advanced directives . List of other physicians . Hospitalizations, surgeries, and ER visits in previous 12 months . Vitals . Screenings to include cognitive, depression, and falls . Referrals and appointments  In addition, I have reviewed and discussed with patient certain preventive protocols, quality metrics, and best practice recommendations. A written personalized care plan for preventive services as well as general preventive health recommendations were provided to patient.     Wynetta Fines, RN  06/16/2016

## 2016-06-16 NOTE — Telephone Encounter (Signed)
Patient in for AWV 06/06  Is concerned insomnia is related  to her thyroid Losing hair; does not have to shave legs;  Weight gain with no diet changes Cold sensitivity  Low metabolism as much exercise as she gets  Requested TSH and labs run in March; 4.64 (slightly elevated)   Gets 3 to 4 hours on average Mentioned stopping etoh at hs to see if this would help her sleep better.  Also mentioned documenting food intake and counting calories to get some more information on weight concerns  Can schedule apt with Dr. Raliegh Ip to discuss or order T3/T4  Please advise if any fup required;

## 2016-06-20 NOTE — Telephone Encounter (Signed)
Please order free T4 as well as repeat TSH

## 2016-06-21 NOTE — Telephone Encounter (Signed)
Lab orders placed in epic. Called pt to make aware, no answer a detailed message was left to return call.

## 2016-06-22 ENCOUNTER — Other Ambulatory Visit: Payer: Self-pay | Admitting: Internal Medicine

## 2016-06-22 ENCOUNTER — Other Ambulatory Visit (INDEPENDENT_AMBULATORY_CARE_PROVIDER_SITE_OTHER): Payer: Medicare Other

## 2016-06-22 DIAGNOSIS — R946 Abnormal results of thyroid function studies: Secondary | ICD-10-CM

## 2016-06-22 DIAGNOSIS — Z1159 Encounter for screening for other viral diseases: Secondary | ICD-10-CM | POA: Diagnosis not present

## 2016-06-22 DIAGNOSIS — M1712 Unilateral primary osteoarthritis, left knee: Secondary | ICD-10-CM | POA: Diagnosis not present

## 2016-06-22 DIAGNOSIS — G4700 Insomnia, unspecified: Secondary | ICD-10-CM

## 2016-06-22 DIAGNOSIS — R7989 Other specified abnormal findings of blood chemistry: Secondary | ICD-10-CM

## 2016-06-22 LAB — TSH: TSH: 3.12 u[IU]/mL (ref 0.35–4.50)

## 2016-06-22 LAB — T4, FREE: FREE T4: 0.62 ng/dL (ref 0.60–1.60)

## 2016-06-22 MED ORDER — LEVOTHYROXINE SODIUM 25 MCG PO TABS: 25.0000 ug | ORAL_TABLET | Freq: Every day | ORAL | 3 refills | Status: DC

## 2016-06-23 LAB — HEPATITIS C ANTIBODY: HCV AB: NEGATIVE

## 2016-06-29 DIAGNOSIS — M1712 Unilateral primary osteoarthritis, left knee: Secondary | ICD-10-CM | POA: Diagnosis not present

## 2016-07-06 DIAGNOSIS — M1712 Unilateral primary osteoarthritis, left knee: Secondary | ICD-10-CM | POA: Diagnosis not present

## 2016-10-04 DIAGNOSIS — L821 Other seborrheic keratosis: Secondary | ICD-10-CM | POA: Diagnosis not present

## 2016-10-04 DIAGNOSIS — D1801 Hemangioma of skin and subcutaneous tissue: Secondary | ICD-10-CM | POA: Diagnosis not present

## 2016-10-17 DIAGNOSIS — Z23 Encounter for immunization: Secondary | ICD-10-CM | POA: Diagnosis not present

## 2016-10-29 DIAGNOSIS — H2513 Age-related nuclear cataract, bilateral: Secondary | ICD-10-CM | POA: Diagnosis not present

## 2016-10-29 DIAGNOSIS — H40013 Open angle with borderline findings, low risk, bilateral: Secondary | ICD-10-CM | POA: Diagnosis not present

## 2016-10-29 DIAGNOSIS — H401431 Capsular glaucoma with pseudoexfoliation of lens, bilateral, mild stage: Secondary | ICD-10-CM | POA: Diagnosis not present

## 2016-11-01 DIAGNOSIS — Z1231 Encounter for screening mammogram for malignant neoplasm of breast: Secondary | ICD-10-CM | POA: Diagnosis not present

## 2016-11-01 LAB — HM MAMMOGRAPHY

## 2016-11-10 ENCOUNTER — Encounter: Payer: Self-pay | Admitting: Internal Medicine

## 2017-04-28 ENCOUNTER — Encounter: Payer: Self-pay | Admitting: Internal Medicine

## 2017-04-28 ENCOUNTER — Ambulatory Visit (INDEPENDENT_AMBULATORY_CARE_PROVIDER_SITE_OTHER): Payer: Medicare Other | Admitting: Internal Medicine

## 2017-04-28 VITALS — BP 124/70 | HR 76 | Temp 98.1°F | Wt 153.0 lb

## 2017-04-28 DIAGNOSIS — E039 Hypothyroidism, unspecified: Secondary | ICD-10-CM | POA: Diagnosis not present

## 2017-04-28 DIAGNOSIS — M8580 Other specified disorders of bone density and structure, unspecified site: Secondary | ICD-10-CM | POA: Diagnosis not present

## 2017-04-28 DIAGNOSIS — E785 Hyperlipidemia, unspecified: Secondary | ICD-10-CM | POA: Diagnosis not present

## 2017-04-28 LAB — COMPREHENSIVE METABOLIC PANEL
ALBUMIN: 4.3 g/dL (ref 3.5–5.2)
ALT: 14 U/L (ref 0–35)
AST: 17 U/L (ref 0–37)
Alkaline Phosphatase: 61 U/L (ref 39–117)
BILIRUBIN TOTAL: 0.7 mg/dL (ref 0.2–1.2)
BUN: 15 mg/dL (ref 6–23)
CALCIUM: 9.1 mg/dL (ref 8.4–10.5)
CO2: 28 mEq/L (ref 19–32)
CREATININE: 0.67 mg/dL (ref 0.40–1.20)
Chloride: 101 mEq/L (ref 96–112)
GFR: 92.28 mL/min (ref >60.00)
Glucose, Bld: 88 mg/dL (ref 70–99)
Potassium: 4.7 mEq/L (ref 3.5–5.1)
SODIUM: 136 meq/L (ref 135–145)
Total Protein: 6.6 g/dL (ref 6.0–8.3)

## 2017-04-28 LAB — LIPID PANEL
CHOLESTEROL: 210 mg/dL — AB (ref 0–200)
HDL: 67.6 mg/dL (ref >39.00)
LDL Cholesterol: 125 mg/dL — ABNORMAL HIGH (ref 0–99)
NonHDL: 142.25
TRIGLYCERIDES: 88 mg/dL (ref 0.0–149.0)
Total CHOL/HDL Ratio: 3
VLDL: 17.6 mg/dL (ref 0.0–40.0)

## 2017-04-28 LAB — TSH: TSH: 3.04 u[IU]/mL (ref 0.35–4.50)

## 2017-04-28 LAB — CBC WITH DIFFERENTIAL/PLATELET
BASOS PCT: 1 % (ref 0.0–3.0)
Basophils Absolute: 0.1 10*3/uL (ref 0.0–0.1)
Eosinophils Absolute: 0.1 10*3/uL (ref 0.0–0.7)
Eosinophils Relative: 2.1 % (ref 0.0–5.0)
HCT: 39.9 % (ref 36.0–46.0)
Hemoglobin: 13.8 g/dL (ref 12.0–15.0)
LYMPHS PCT: 42 % (ref 12.0–46.0)
Lymphs Abs: 2.2 10*3/uL (ref 0.7–4.0)
MCHC: 34.6 g/dL (ref 30.0–36.0)
MCV: 97.2 fl (ref 78.0–100.0)
Monocytes Absolute: 0.5 10*3/uL (ref 0.1–1.0)
Monocytes Relative: 9.5 % (ref 3.0–12.0)
NEUTROS ABS: 2.3 10*3/uL (ref 1.4–7.7)
NEUTROS PCT: 45.4 % (ref 43.0–77.0)
Platelets: 323 10*3/uL (ref 150.0–400.0)
RBC: 4.1 Mil/uL (ref 3.87–5.11)
RDW: 12.6 % (ref 11.5–15.5)
WBC: 5.2 10*3/uL (ref 4.0–10.5)

## 2017-04-28 LAB — T4, FREE: Free T4: 0.89 ng/dL (ref 0.60–1.60)

## 2017-04-28 MED ORDER — LEVOTHYROXINE SODIUM 25 MCG PO TABS: 25.0000 ug | ORAL_TABLET | Freq: Every day | ORAL | 3 refills | Status: DC

## 2017-04-28 MED ORDER — LORAZEPAM 0.5 MG PO TABS: 0.5000 mg | ORAL_TABLET | Freq: Every day | ORAL | 2 refills | Status: DC | PRN

## 2017-04-28 MED ORDER — ZOLPIDEM TARTRATE 10 MG PO TABS: ORAL_TABLET | ORAL | 2 refills | Status: DC

## 2017-04-28 NOTE — Progress Notes (Signed)
Subjective:    Patient ID: [name], female    DOB: 04/04/1945, 72 y.o.   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  She has a history of hypothyroidism and has been on supplemental levothyroxine. She uses lorazepam and zolpidem sparingly.  Need for new refills prompted the visit today.  Asymptomatic without concerns or complaints.s   Past Medical History:  Diagnosis Date  . Osteopenia      Social History   Socioeconomic History  . Marital status: Married    Spouse name: Not on file  . Number of children: Not on file  . Years of education: Not on file  . Highest education level: Not on file  Occupational History  . Not on file  Social Needs  . Financial resource strain: Not on file  . Food insecurity:    Worry: Not on file    Inability: Not on file  . Transportation needs:    Medical: Not on file    Non-medical: Not on file  Tobacco Use  . Smoking status: Former Smoker    Packs/day: 0.50    Years: 30.00    Pack years: 15.00    Types: Cigarettes    Last attempt to quit: 03/12/2011    Years since quitting: 6.1  . Smokeless tobacco: Never Used  Substance and Sexual Activity  . Alcohol use: Yes    Comment: 2 glasses of wine  . Drug use: No  . Sexual activity: Not on file  Lifestyle  . Physical activity:    Days per week: Not on file    Minutes per session: Not on file  . Stress: Not on file  Relationships  . Social connections:    Talks on phone: Not on file    Gets together: Not on file    Attends religious service: Not on file    Active member of club or organization: Not on file    Attends meetings of clubs or organizations: Not on file    Relationship status: Not on file  . Intimate partner violence:    Fear of current or ex partner: Not on file    Emotionally abused: Not on file    Physically abused: Not on file    Forced sexual activity: Not on file  Other Topics Concern  . Not on file    Social History Narrative  . Not on file    History reviewed. No pertinent surgical history.  Family History  Problem Relation Age of Onset  . Heart disease Mother   . Arthritis Mother   . Hypertension Father   . Arthritis Father     No Known Allergies  Current Outpatient Medications on File Prior to Visit  Medication Sig Dispense Refill  . Multiple Vitamin (MULTIVITAMIN) tablet Take 1 tablet by mouth daily.    . Omega-3 Fatty Acids (FISH OIL) 1200 MG CAPS Take 1 capsule by mouth daily.    Vladimir Faster Glycol-Propyl Glycol (SYSTANE) 0.4-0.3 % SOLN Apply 1 drop to eye 3 (three) times daily.    . vitamin C (ASCORBIC ACID) 500 MG tablet Take 500 mg by mouth daily.    . vitamin E 400 UNIT capsule Take 400 Units by mouth daily.     No current facility-administered medications on file prior to visit.     BP 124/70 (BP Location: Right Arm, Patient Position: Sitting, Cuff Size: Normal)   Pulse 76   Temp 98.1 F (  36.7 C) (Oral)   Wt 153 lb (69.4 kg)   SpO2 99%   BMI 27.98 kg/m     Review of Systems  Constitutional: Negative.   HENT: Negative for congestion, dental problem, hearing loss, rhinorrhea, sinus pressure, sore throat and tinnitus.   Eyes: Negative for pain, discharge and visual disturbance.  Respiratory: Negative for cough and shortness of breath.   Cardiovascular: Negative for chest pain, palpitations and leg swelling.  Gastrointestinal: Negative for abdominal distention, abdominal pain, blood in stool, constipation, diarrhea, nausea and vomiting.  Genitourinary: Negative for difficulty urinating, dysuria, flank pain, frequency, hematuria, pelvic pain, urgency, vaginal bleeding, vaginal discharge and vaginal pain.  Musculoskeletal: Negative for arthralgias, gait problem and joint swelling.  Skin: Negative for rash.  Neurological: Negative for dizziness, syncope, speech difficulty, weakness, numbness and headaches.  Hematological: Negative for adenopathy.   Psychiatric/Behavioral: Positive for sleep disturbance. Negative for agitation, behavioral problems and dysphoric mood. The patient is not nervous/anxious.        Objective:   Physical Exam  Constitutional: She is oriented to person, place, and time. She appears well-developed and well-nourished.  HENT:  Head: Normocephalic.  Right Ear: External ear normal.  Left Ear: External ear normal.  Mouth/Throat: Oropharynx is clear and moist.  Eyes: Pupils are equal, round, and reactive to light. Conjunctivae and EOM are normal.  Neck: Normal range of motion. Neck supple. No thyromegaly present.  Cardiovascular: Normal rate, regular rhythm, normal heart sounds and intact distal pulses.  Pulmonary/Chest: Effort normal and breath sounds normal.  Abdominal: Soft. Bowel sounds are normal. She exhibits no mass. There is no tenderness.  Musculoskeletal: Normal range of motion.  Lymphadenopathy:    She has no cervical adenopathy.  Neurological: She is alert and oriented to person, place, and time.  Skin: Skin is warm and dry. No rash noted.  Psychiatric: She has a normal mood and affect. Her behavior is normal.          Assessment & Plan:  Hypothyroidism  Episodic insomnia Remote tobacco use  Medications updated We will check updated lab Patient will schedule annual exam with new provider early next year  Follow-up here as needed  Nyoka Cowden

## 2017-04-28 NOTE — Patient Instructions (Signed)
Return in one year for follow-up

## 2017-06-20 NOTE — Progress Notes (Addendum)
Subjective:   [name] is a 72 y.o. female who presents for Medicare Annual (Subsequent) preventive examination.  Reports health as good  Dx Ostoeporosis - was started on Boniva at one time C/o of left knee pain   Psychosocial  Retired Therapist, sports Father died 21 with dementia and htn Mother died at 50;  2 brothers, one sister 2 dtr; grandchildren Involved in 3 women's clubs dtr in [place] they visit [employer] Lunches at church   Diet BMI 28 Chol /hdl 3; hdl 67; trig 88 Last ov 04/28/2017 Stays away for Carbs except in the am Fruit; vegetables the rest of the day   Exercise Goes 5 am's a week; weights; sit ups etc Walking on the week end Works in the yard  ETOH 2 glasses of wine Tobacco; quit 2013; 15 pack years    There are no preventive care reminders to display for this patient.  Colonoscopy 11/2012 will repeat this year in Nov; ([facility] )  10/2016 mammogram- will repeat in Oct   Dexa 08/2015  -2.60; osteoporosis  Did take fosamax or other at one time  Given the osteoporosis foundation site to review meds Decided to repeat dexa this year and monitor for 1200mg  of Calcium with Vit D   Shingrix discussed   Cardiac Risk Factors include: advanced age (>47men, >46 women)     Objective:     Vitals: BP 124/70   Pulse 78   Ht 5\' 2"  (1.575 m)   Wt 153 lb (69.4 kg)   SpO2 98%   BMI 27.98 kg/m   Body mass index is 27.98 kg/m.  Advanced Directives 06/21/2017 06/16/2016  Does Patient Have a Medical Advance Directive? Yes Yes    Tobacco Social History   Tobacco Use  Smoking Status Former Smoker  . Packs/day: 0.50  . Years: 30.00  . Pack years: 15.00  . Types: Cigarettes  . Last attempt to quit: 03/12/2011  . Years since quitting: 6.2  Smokeless Tobacco Never Used     Counseling given: Yes   Clinical Intake:     Past Medical History:  Diagnosis Date  . Osteopenia    No past surgical history on file. Family  History  Problem Relation Age of Onset  . Heart disease Mother   . Arthritis Mother   . Hypertension Father   . Arthritis Father    Social History   Socioeconomic History  . Marital status: Married    Spouse name: Not on file  . Number of children: Not on file  . Years of education: Not on file  . Highest education level: Not on file  Occupational History  . Not on file  Social Needs  . Financial resource strain: Not on file  . Food insecurity:    Worry: Not on file    Inability: Not on file  . Transportation needs:    Medical: Not on file    Non-medical: Not on file  Tobacco Use  . Smoking status: Former Smoker    Packs/day: 0.50    Years: 30.00    Pack years: 15.00    Types: Cigarettes    Last attempt to quit: 03/12/2011    Years since quitting: 6.2  . Smokeless tobacco: Never Used  Substance and Sexual Activity  . Alcohol use: Yes    Comment: 2 glasses of wine per day  . Drug use: No  . Sexual activity: Not on file  Lifestyle  .  Physical activity:    Days per week: Not on file    Minutes per session: Not on file  . Stress: Not on file  Relationships  . Social connections:    Talks on phone: Not on file    Gets together: Not on file    Attends religious service: Not on file    Active member of club or organization: Not on file    Attends meetings of clubs or organizations: Not on file    Relationship status: Not on file  Other Topics Concern  . Not on file  Social History Narrative  . Not on file    Outpatient Encounter Medications as of 06/21/2017  Medication Sig  . levothyroxine (SYNTHROID, LEVOTHROID) 25 MCG tablet Take 1 tablet (25 mcg total) by mouth daily before breakfast.  . LORazepam (ATIVAN) 0.5 MG tablet Take 1 tablet (0.5 mg total) by mouth daily as needed.  . Multiple Vitamin (MULTIVITAMIN) tablet Take 1 tablet by mouth daily.  . naproxen sodium (ALEVE) 220 MG tablet Take 220 mg by mouth.  . Omega-3 Fatty Acids (FISH OIL) 1200 MG CAPS Take 1  capsule by mouth daily.  Vladimir Faster Glycol-Propyl Glycol (SYSTANE) 0.4-0.3 % SOLN Apply 1 drop to eye 3 (three) times daily.  . vitamin C (ASCORBIC ACID) 500 MG tablet Take 500 mg by mouth daily.  . vitamin E 400 UNIT capsule Take 400 Units by mouth daily.  Marland Kitchen zolpidem (AMBIEN) 10 MG tablet TAKE 1 TABLET AT BEDTIME AS NEEDED FOR SLEEP   No facility-administered encounter medications on file as of 06/21/2017.     Activities of Daily Living In your present state of health, do you have any difficulty performing the following activities: 06/21/2017  Hearing? N  Vision? N  Difficulty concentrating or making decisions? N  Walking or climbing stairs? N  Dressing or bathing? N  Doing errands, shopping? N  Preparing Food and eating ? N  Using the Toilet? N  In the past six months, have you accidently leaked urine? N  Do you have problems with loss of bowel control? N  Managing your Medications? N  Managing your Finances? N  Housekeeping or managing your Housekeeping? N  Some recent data might be hidden    Patient Care Team: Marletta Lor, MD as PCP - General    Assessment:   This is a routine wellness examination for [facility].  Exercise Activities and Dietary recommendations Current Exercise Habits: Structured exercise class, Time (Minutes): 60, Frequency (Times/Week): 6, Weekly Exercise (Minutes/Week): 360, Intensity: Moderate  Goals    . Patient Stated     Add some aerobics to exercise and continue to watch diet     . Weight (lb) < 145 lb (65.8 kg)     Check out  online nutrition programs as GumSearch.nl and http://vang.com/; fit52me; Can use lose it  May want to keep a diary for approx 1 month; to see if weight is responds to calorie reduction You can use calorieking.com  Look for foods with "whole" wheat; bran; oatmeal etc Shot at the farmer's markets in season for fresher choices  Watch for "hydrogenated" on the label of oils which are trans-fats.  Watch for  "high fructose corn syrup" in snacks, yogurt or ketchup  Meats have less marbling; bright colored fruits and vegetables;  Canned; dump out liquid and wash vegetables. Be mindful of what we are eating  Portion control is essential to a health weight! Sit down; take a break and enjoy your  meal; take smaller bites; put the fork down between bites;  It takes 20 minutes to get full; so check in with your fullness cues and stop eating when you start to fill full              Fall Risk Fall Risk  06/21/2017 06/16/2016 03/17/2016 09/09/2015  Falls in the past year? No No No No  Comment fail in [place] in January  - - Emmi Telephone Survey: data to providers prior to load     Depression Screen PHQ 2/9 Scores 06/21/2017 06/16/2016 03/17/2016  PHQ - 2 Score 0 0 0     Cognitive Function MMSE - Mini Mental State Exam 06/21/2017 06/16/2016  Not completed: (No Data) (No Data)   Ad8 score reviewed for issues:  Issues making decisions:  Less interest in hobbies / activities:  Repeats questions, stories (family complaining):  Trouble using ordinary gadgets (microwave, computer, phone):  Forgets the month or year:   Mismanaging finances:   Remembering appts:  Daily problems with thinking and/or memory: Ad8 score is=0         Immunization History  Administered Date(s) Administered  . Influenza Whole 11/24/2009  . Influenza, High Dose Seasonal PF 10/23/2014, 10/21/2015  . Pneumococcal Conjugate-13 10/23/2014  . Pneumococcal Polysaccharide-23 03/17/2016  . Tdap 06/11/2012     Screening Tests Health Maintenance  Topic Date Due  . INFLUENZA VACCINE  08/11/2017  . MAMMOGRAM  11/01/2017  . COLONOSCOPY  11/24/2017  . TETANUS/TDAP  06/12/2022  . DEXA SCAN  Completed  . Hepatitis C Screening  Completed  . PNA vac Low Risk Adult  Completed        Plan:      PCP Notes   Health Maintenance Colonoscopy 11/2012 will repeat this year in Nov; ([facility] )  10/2016 mammogram- will  repeat in Oct   Dexa 08/2015  -2.60; osteoporosis  Did take fosamax and other at one time  Given the osteoporosis foundation site to review meds to treat with discussion on options   The patient decided  to repeat dexa this year in Sept  and monitor for 1200mg  of Calcium with Vit D  Will discuss options at next OV; not sure if she wants to take meds and will review online.  (Not sure which MD she will schedule with p Dr. 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 Medical and social history . Use of alcohol, tobacco or illicit drugs  . Current medications and supplements . Functional ability and status . Nutritional status . Physical activity . Advanced directives . List of other physicians . Hospitalizations, surgeries, and ER visits in previous 12 months . Vitals . Screenings to include cognitive, depression, and falls . Referrals and appointments  In addition, I have reviewed and discussed with patient certain preventive protocols, quality metrics, and best practice recommendations. A written personalized care plan for preventive services as well as general preventive health recommendations were provided to patient.     GDJME,QASTM, RN  06/21/2017  Subsequent  Medicare wellness visit reviewed and agree with findings.  Patient is scheduled for annual clinical exam.  Marletta Lor

## 2017-06-21 ENCOUNTER — Ambulatory Visit (INDEPENDENT_AMBULATORY_CARE_PROVIDER_SITE_OTHER): Payer: Medicare Other

## 2017-06-21 VITALS — BP 124/70 | HR 78 | Ht 62.0 in | Wt 153.0 lb

## 2017-06-21 DIAGNOSIS — M8580 Other specified disorders of bone density and structure, unspecified site: Secondary | ICD-10-CM

## 2017-06-21 DIAGNOSIS — Z0000 Encounter for general adult medical examination without abnormal findings: Secondary | ICD-10-CM | POA: Diagnosis not present

## 2017-06-21 DIAGNOSIS — M810 Age-related osteoporosis without current pathological fracture: Secondary | ICD-10-CM

## 2017-06-21 NOTE — Patient Instructions (Addendum)
[name] , Thank you for taking time to come for your Medicare Wellness Visit. I appreciate your ongoing commitment to your health goals. Please review the following plan we discussed and let me know if I can assist you in the future.   Calcium 1252m with Vit D 800u per day; more as directed by physician Strength building exercises discussed; can include walking; housework; small weights or stretch bands; silver sneakers if [facility] to the [facility]  Please visit the osteoporosis foundation.org for up to date recommendations Will recheck dexa in Sept of this year; since you had this a [facility], I will go ahead and order.  Shingrix is a vaccine for the prevention of Shingles in Adults 50 and older.  If you are on Medicare, the shingrix is covered under your Part D plan, so you will take both of the vaccines in the series at your pharmacy. Please check with your benefits regarding applicable copays or out of pocket expenses.  The Shingrix is given in 2 vaccines approx 8 weeks apart. You must receive the 2nd dose prior to 6 months from receipt of the first. Please have the pharmacist print out you Immunization  dates for our office records    Shingrix is a vaccine for the prevention of Shingles in Adults 50 and older.  If you are on Medicare, the shingrix is covered under your Part D plan, so you will take both of the vaccines in the series at your pharmacy. Please check with your benefits regarding applicable copays or out of pocket expenses.  The Shingrix is given in 2 vaccines approx 8 weeks apart. You must receive the 2nd dose prior to 6 months from receipt of the first. Please have the pharmacist print out you Immunization  dates for our office records    These are the goals we discussed: Goals    . Patient Stated     Add some aerobics to exercise and continue to watch diet     . Weight (lb) < 145 lb (65.8 kg)     Check out  online nutrition programs as cGumSearch.[place] mhttp://vang.com/  [email or web address] Can use lose it  May want to keep a diary for approx 1 month; to see if weight is responds to calorie reduction You can use calorieking.com  Look for foods with "whole" wheat; bran; oatmeal etc Shot at the farmer's markets in season for fresher choices  Watch for "hydrogenated" on the label of oils which are trans-fats.  Watch for "high fructose corn syrup" in snacks, yogurt or ketchup  Meats have less marbling; bright colored fruits and vegetables;  Canned; dump out liquid and wash vegetables. Be mindful of what we are eating  Portion control is essential to a health weight! Sit down; take a break and enjoy your meal; take smaller bites; put the fork down between bites;  It takes 20 minutes to get full; so check in with your fullness cues and stop eating when you start to fill full              This is a list of the screening recommended for you and due dates:  Health Maintenance  Topic Date Due  . Flu Shot  08/11/2017  . Mammogram  11/01/2017  . Colon Cancer Screening  11/24/2017  . Tetanus Vaccine  06/12/2022  . DEXA scan (bone density measurement)  Completed  .  Hepatitis C: One time screening is recommended by Center for Disease Control  (CDC) for  adults born  from [place] through 1965.   Completed  . Pneumonia vaccines  Completed   Insomnia Insomnia is a sleep disorder that makes it difficult to fall asleep or to stay asleep. Insomnia can cause tiredness (fatigue), low energy, difficulty concentrating, mood swings, and poor performance at work or school. There are three different ways to classify insomnia:  Difficulty falling asleep.  Difficulty staying asleep.  Waking up too early in the morning.  Any type of insomnia can be long-term (chronic) or short-term (acute). Both are common. Short-term insomnia usually lasts for three months or less. Chronic insomnia occurs at least three times a week for longer than three months. What are the causes? Insomnia may  be caused by another condition, situation, or substance, such as:  Anxiety.  Certain medicines.  Gastroesophageal reflux disease (GERD) or other gastrointestinal conditions.  Asthma or other breathing conditions.  Restless legs syndrome, sleep apnea, or other sleep disorders.  Chronic pain.  Menopause. This may include hot flashes.  Stroke.  Abuse of alcohol, tobacco, or illegal drugs.  Depression.  Caffeine.  Neurological disorders, such as Alzheimer disease.  An overactive thyroid (hyperthyroidism).  The cause of insomnia may not be known. What increases the risk? Risk factors for insomnia include:  Gender. Women are more commonly affected than men.  Age. Insomnia is more common as you get older.  Stress. This may involve your professional or personal life.  Income. Insomnia is more common in people with lower income.  Lack of exercise.  Irregular work schedule or night shifts.  Traveling between different time zones.  What are the signs or symptoms? If you have insomnia, trouble falling asleep or trouble staying asleep is the main symptom. This may lead to other symptoms, such as:  Feeling fatigued.  Feeling nervous about going to sleep.  Not feeling rested in the morning.  Having trouble concentrating.  Feeling irritable, anxious, or depressed.  How is this treated? Treatment for insomnia depends on the cause. If your insomnia is caused by an underlying condition, treatment will focus on addressing the condition. Treatment may also include:  Medicines to help you sleep.  Counseling or therapy.  Lifestyle adjustments.  Follow these instructions at home:  Take medicines only as directed by your health care provider.  Keep regular sleeping and waking hours. Avoid naps.  Keep a sleep diary to help you and your health care provider figure out what could be causing your insomnia. Include: ? When you sleep. ? When you wake up during the  night. ? How well you sleep. ? How rested you feel the next day. ? Any side effects of medicines you are taking. ? What you eat and drink.  Make your bedroom a comfortable place where it is easy to fall asleep: ? Put up shades or special blackout curtains to block light from outside. ? Use a white noise machine to block noise. ? Keep the temperature cool.  Exercise regularly as directed by your health care provider. Avoid exercising right before bedtime.  Use relaxation techniques to manage stress. Ask your health care provider to suggest some techniques that may work well for you. These may include: ? Breathing exercises. ? Routines to release muscle tension. ? Visualizing peaceful scenes.  Cut back on alcohol, caffeinated beverages, and cigarettes, especially close to bedtime. These can disrupt your sleep.  Do not overeat or eat spicy foods right before bedtime. This can lead to digestive discomfort that can make it hard for you to sleep.  Limit screen use before bedtime. This includes: ? Watching TV. ? Using your smartphone, tablet, and computer.  Stick to a routine. This can help you fall asleep faster. Try to do a quiet activity, brush your teeth, and go to bed at the same time each night.  Get out of bed if you are still awake after 15 minutes of trying to sleep. Keep the lights down, but try reading or doing a quiet activity. When you feel sleepy, go back to bed.  Make sure that you drive carefully. Avoid driving if you feel very sleepy.  Keep all follow-up appointments as directed by your health care provider. This is important. Contact a health care provider if:  You are tired throughout the day or have trouble in your daily routine due to sleepiness.  You continue to have sleep problems or your sleep problems get worse. Get help right away if:  You have serious thoughts about hurting yourself or someone else. This information is not intended to replace advice given  to you by your health care provider. Make sure you discuss any questions you have with your health care provider. Document Released: 12/26/1999 Document Revised: 05/30/2015 Document Reviewed: 09/28/2013 Elsevier Interactive Patient Education  2018 [employer].   Bone Densitometry Bone densitometry is an imaging test that uses a special X-ray to measure the amount of calcium and other minerals in your bones (bone density). This test is also known as a bone mineral density test or dual-energy X-ray absorptiometry (DXA). The test can measure bone density at your hip and your spine. It is similar to having a regular X-ray. You may have this test to:  Diagnose a condition that causes weak or thin bones (osteoporosis).  Predict your risk of a broken bone (fracture).  Determine how well osteoporosis treatment is working.  Tell a health care provider about:  Any allergies you have.  All medicines you are taking, including vitamins, herbs, eye drops, creams, and over-the-counter medicines.  Any problems you or family members have had with anesthetic medicines.  Any blood disorders you have.  Any surgeries you have had.  Any medical conditions you have.  Possibility of pregnancy.  Any other medical test you had within the previous 14 days that used contrast material. What are the risks? Generally, this is a safe procedure. However, problems can occur and may include the following:  This test exposes you to a very small amount of radiation.  The risks of radiation exposure may be greater to unborn children.  What happens before the procedure?  Do not take any calcium supplements for 24 hours before having the test. You can otherwise eat and drink what you usually do.  Take off all metal jewelry, eyeglasses, dental appliances, and any other metal objects. What happens during the procedure?  You may lie on an exam table. There will be an X-ray generator below you and an imaging  device above you.  Other devices, such as boxes or braces, may be used to position your body properly for the scan.  You will need to lie still while the machine slowly scans your body.  The images will show up on a computer monitor. What happens after the procedure? You may need more testing at a later time. This information is not intended to replace advice given to you by your health care provider. Make sure you discuss any questions you have with your health care provider. Document Released: 01/20/2004 Document Revised: 06/05/2015 Document Reviewed: 06/07/2013 Elsevier  Interactive Patient Education  2018 [place] in the Home Falls can cause injuries. They can happen to people of all ages. There are many things you can do to make your home safe and to help prevent falls. What can I do on the outside of my home?  Regularly fix the edges of walkways and driveways and fix any cracks.  Remove anything that might make you trip as you walk through a door, such as a raised step or threshold.  Trim any bushes or trees on the path to your home.  Use bright outdoor lighting.  Clear any walking paths of anything that might make someone trip, such as rocks or tools.  Regularly check to see if handrails are loose or broken. Make sure that both sides of any steps have handrails.  Any raised decks and porches should have guardrails on the edges.  Have any leaves, snow, or ice cleared regularly.  Use sand or salt on walking paths during winter.  Clean up any spills in your garage right away. This includes oil or grease spills. What can I do in the bathroom?  Use night lights.  Install grab bars by the toilet and in the tub and shower. Do not use towel bars as grab bars.  Use non-skid mats or decals in the tub or shower.  If you need to sit down in the shower, use a plastic, non-slip stool.  Keep the floor dry. Clean up any water that spills on the floor as  soon as it happens.  Remove soap buildup in the tub or shower regularly.  Attach bath mats securely with double-sided non-slip rug tape.  Do not have throw rugs and other things on the floor that can make you trip. What can I do in the bedroom?  Use night lights.  Make sure that you have a light by your bed that is easy to reach.  Do not use any sheets or blankets that are too big for your bed. They should not hang down onto the floor.  Have a firm chair that has side arms. You can use this for support while you get dressed.  Do not have throw rugs and other things on the floor that can make you trip. What can I do in the kitchen?  Clean up any spills right away.  Avoid walking on wet floors.  Keep items that you use a lot in easy-to-reach places.  If you need to reach something above you, use a strong step stool that has a grab bar.  Keep electrical cords out of the way.  Do not use floor polish or wax that makes floors slippery. If you must use wax, use non-skid floor wax.  Do not have throw rugs and other things on the floor that can make you trip. What can I do with my stairs?  Do not leave any items on the stairs.  Make sure that there are handrails on both sides of the stairs and use them. Fix handrails that are broken or loose. Make sure that handrails are as long as the stairways.  Check any carpeting to make sure that it is firmly attached to the stairs. Fix any carpet that is loose or worn.  Avoid having throw rugs at the top or bottom of the stairs. If you do have throw rugs, attach them to the floor with carpet tape.  Make sure that you have a light switch at the top of the stairs  and the bottom of the stairs. If you do not have them, ask someone to add them for you. What else can I do to help prevent falls?  Wear shoes that: ? Do not have high heels. ? Have rubber bottoms. ? Are comfortable and fit you well. ? Are closed at the toe. Do not wear  sandals.  If you use a stepladder: ? Make sure that it is fully opened. Do not climb a closed stepladder. ? Make sure that both sides of the stepladder are locked into place. ? Ask someone to hold it for you, if possible.  Clearly mark and make sure that you can see: ? Any grab bars or handrails. ? First and last steps. ? Where the edge of each step is.  Use tools that help you move around (mobility aids) if they are needed. These include: ? Canes. ? Walkers. ? Scooters. ? Crutches.  Turn on the lights when you go into a dark area. Replace any light bulbs as soon as they burn out.  Set up your furniture so you have a clear path. Avoid moving your furniture around.  If any of your floors are uneven, fix them.  If there are any pets around you, be aware of where they are.  Review your medicines with your doctor. Some medicines can make you feel dizzy. This can increase your chance of falling. Ask your doctor what other things that you can do to help prevent falls. This information is not intended to replace advice given to you by your health care provider. Make sure you discuss any questions you have with your health care provider. Document Released: 10/24/2008 Document Revised: 06/05/2015 Document Reviewed: 02/01/2014 Elsevier Interactive Patient Education  2018 [place] Maintenance, Female Adopting a healthy lifestyle and getting preventive care can go a long way to promote health and wellness. Talk with your health care provider about what schedule of regular examinations is right for you. This is a good chance for you to check in with your provider about disease prevention and staying healthy. In between checkups, there are plenty of things you can do on your own. Experts have done a lot of research about which lifestyle changes and preventive measures are most likely to keep you healthy. Ask your health care provider for more information. Weight and diet Eat a  healthy diet  Be sure to include plenty of vegetables, fruits, low-fat dairy products, and lean protein.  Do not eat a lot of foods high in solid fats, added sugars, or salt.  Get regular exercise. This is one of the most important things you can do for your health. ? Most adults should exercise for at least 150 minutes each week. The exercise should increase your heart rate and make you sweat (moderate-intensity exercise). ? Most adults should also do strengthening exercises at least twice a week. This is in addition to the moderate-intensity exercise.  Maintain a healthy weight  Body mass index (BMI) is a measurement that can be used to identify possible weight problems. It estimates body fat based on height and weight. Your health care provider can help determine your BMI and help you achieve or maintain a healthy weight.  For females 28 years of age and older: ? A BMI below 18.5 is considered underweight. ? A BMI of 18.5 to 24.9 is normal. ? A BMI of 25 to 29.9 is considered overweight. ? A BMI of 30 and above is considered obese.  Watch levels  of cholesterol and blood lipids  You should start having your blood tested for lipids and cholesterol at 71 years of age, then have this test every 5 years.  You may need to have your cholesterol levels checked more often if: ? Your lipid or cholesterol levels are high. ? You are older than 71 years of age. ? You are at high risk for heart disease.  Cancer screening Lung Cancer  Lung cancer screening is recommended for adults 3-64 years old who are at high risk for lung cancer because of a history of smoking.  A yearly low-dose CT scan of the lungs is recommended for people who: ? Currently smoke. ? Have quit within the past 15 years. ? Have at least a 30-pack-year history of smoking. A pack year is smoking an average of one pack of cigarettes a day for 1 year.  Yearly screening should continue until it has been 15 years since you  quit.  Yearly screening should stop if you develop a health problem that would prevent you from having lung cancer treatment.  Breast Cancer  Practice breast self-awareness. This means understanding how your breasts normally appear and feel.  It also means doing regular breast self-exams. Let your health care provider know about any changes, no matter how small.  If you are in your 20s or 30s, you should have a clinical breast exam (CBE) by a health care provider every 1-3 years as part of a regular health exam.  If you are 2 or older, have a CBE every year. Also consider having a breast X-ray (mammogram) every year.  If you have a family history of breast cancer, talk to your health care provider about genetic screening.  If you are at high risk for breast cancer, talk to your health care provider about having an MRI and a mammogram every year.  Breast cancer gene (BRCA) assessment is recommended for women who have family members with BRCA-related cancers. BRCA-related cancers include: ? Breast. ? Ovarian. ? Tubal. ? Peritoneal cancers.  Results of the assessment will determine the need for genetic counseling and BRCA1 and BRCA2 testing.  Cervical Cancer Your health care provider may recommend that you be screened regularly for cancer of the pelvic organs (ovaries, uterus, and vagina). This screening involves a pelvic examination, including checking for microscopic changes to the surface of your cervix (Pap test). You may be encouraged to have this screening done every 3 years, beginning at age 56.  For women ages 44-65, health care providers may recommend pelvic exams and Pap testing every 3 years, or they may recommend the Pap and pelvic exam, combined with testing for human papilloma virus (HPV), every 5 years. Some types of HPV increase your risk of cervical cancer. Testing for HPV may also be done on women of any age with unclear Pap test results.  Other health care providers  may not recommend any screening for nonpregnant women who are considered low risk for pelvic cancer and who do not have symptoms. Ask your health care provider if a screening pelvic exam is right for you.  If you have had past treatment for cervical cancer or a condition that could lead to cancer, you need Pap tests and screening for cancer for at least 20 years after your treatment. If Pap tests have been discontinued, your risk factors (such as having a new sexual partner) need to be reassessed to determine if screening should resume. Some women have medical problems that increase the chance  of getting cervical cancer. In these cases, your health care provider may recommend more frequent screening and Pap tests.  Colorectal Cancer  This type of cancer can be detected and often prevented.  Routine colorectal cancer screening usually begins at 71 years of age and continues through 71 years of age.  Your health care provider may recommend screening at an earlier age if you have risk factors for colon cancer.  Your health care provider may also recommend using home test kits to check for hidden blood in the stool.  A small camera at the end of a tube can be used to examine your colon directly (sigmoidoscopy or colonoscopy). This is done to check for the earliest forms of colorectal cancer.  Routine screening usually begins at age 32.  Direct examination of the colon should be repeated every 5-10 years through 72 years of age. However, you may need to be screened more often if early forms of precancerous polyps or small growths are found.  Skin Cancer  Check your skin from head to toe regularly.  Tell your health care provider about any new moles or changes in moles, especially if there is a change in a mole's shape or color.  Also tell your health care provider if you have a mole that is larger than the size of a pencil eraser.  Always use sunscreen. Apply sunscreen liberally and repeatedly  throughout the day.  Protect yourself by wearing long sleeves, pants, a wide-brimmed hat, and sunglasses whenever you are outside.  Heart disease, diabetes, and high blood pressure  High blood pressure causes heart disease and increases the risk of stroke. High blood pressure is more likely to develop in: ? People who have blood pressure in the high end of the normal range (130-139/85-89 mm Hg). ? People who are overweight or obese. ? People who are African American.  If you are 62-35 years of age, have your blood pressure checked every 3-5 years. If you are 34 years of age or older, have your blood pressure checked every year. You should have your blood pressure measured twice-once when you are at a hospital or clinic, and once when you are not at a hospital or clinic. Record the average of the two measurements. To check your blood pressure when you are not at a hospital or clinic, you can use: ? An automated blood pressure machine at a pharmacy. ? A home blood pressure monitor.  If you are between 85 years and 39 years old, ask your health care provider if you should take aspirin to prevent strokes.  Have regular diabetes screenings. This involves taking a blood sample to check your fasting blood sugar level. ? If you are at a normal weight and have a low risk for diabetes, have this test once every three years after 71 years of age. ? If you are overweight and have a high risk for diabetes, consider being tested at a younger age or more often. Preventing infection Hepatitis B  If you have a higher risk for hepatitis B, you should be screened for this virus. You are considered at high risk for hepatitis B if: ? You were born in a country where hepatitis B is common. Ask your health care provider which countries are considered high risk. ? Your parents were born in a high-risk country, and you have not been immunized against hepatitis B (hepatitis B vaccine). ? You have HIV or AIDS. ? You  use needles to inject street drugs. ?  You live with someone who has hepatitis B. ? You have had sex with someone who has hepatitis B. ? You get hemodialysis treatment. ? You take certain medicines for conditions, including cancer, organ transplantation, and autoimmune conditions.  Hepatitis C  Blood testing is recommended for: ? Everyone born from [birth year] through 1964. ? Anyone with known risk factors for hepatitis C.  Sexually transmitted infections (STIs)  You should be screened for sexually transmitted infections (STIs) including gonorrhea and chlamydia if: ? You are sexually active and are younger than 71 years of age. ? You are older than 71 years of age and your health care provider tells you that you are at risk for this type of infection. ? Your sexual activity has changed since you were last screened and you are at an increased risk for chlamydia or gonorrhea. Ask your health care provider if you are at risk.  If you do not have HIV, but are at risk, it may be recommended that you take a prescription medicine daily to prevent HIV infection. This is called pre-exposure prophylaxis (PrEP). You are considered at risk if: ? You are sexually active and do not regularly use condoms or know the HIV status of your partner(s). ? You take drugs by injection. ? You are sexually active with a partner who has HIV.  Talk with your health care provider about whether you are at high risk of being infected with HIV. If you choose to begin PrEP, you should first be tested for HIV. You should then be tested every 3 months for as long as you are taking PrEP. Pregnancy  If you are premenopausal and you may become pregnant, ask your health care provider about preconception counseling.  If you may become pregnant, take 400 to 800 micrograms (mcg) of folic acid every day.  If you want to prevent pregnancy, talk to your health care provider about birth control (contraception). Osteoporosis and  menopause  Osteoporosis is a disease in which the bones lose minerals and strength with aging. This can result in serious bone fractures. Your risk for osteoporosis can be identified using a bone density scan.  If you are 64 years of age or older, or if you are at risk for osteoporosis and fractures, ask your health care provider if you should be screened.  Ask your health care provider whether you should take a calcium or vitamin D supplement to lower your risk for osteoporosis.  Menopause may have certain physical symptoms and risks.  Hormone replacement therapy may reduce some of these symptoms and risks. Talk to your health care provider about whether hormone replacement therapy is right for you. Follow these instructions at home:  Schedule regular health, dental, and eye exams.  Stay current with your immunizations.  Do not use any tobacco products including cigarettes, chewing tobacco, or electronic cigarettes.  If you are pregnant, do not drink alcohol.  If you are breastfeeding, limit how much and how often you drink alcohol.  Limit alcohol intake to no more than 1 drink per day for nonpregnant women. One drink equals 12 ounces of beer, 5 ounces of wine, or 1 ounces of hard liquor.  Do not use street drugs.  Do not share needles.  Ask your health care provider for help if you need support or information about quitting drugs.  Tell your health care provider if you often feel depressed.  Tell your health care provider if you have ever been abused or do not feel safe at  home. This information is not intended to replace advice given to you by your health care provider. Make sure you discuss any questions you have with your health care provider. Document Released: 07/13/2010 Document Revised: 06/05/2015 Document Reviewed: 10/01/2014 Elsevier Interactive Patient Education  [employer].

## 2017-07-19 DIAGNOSIS — M1712 Unilateral primary osteoarthritis, left knee: Secondary | ICD-10-CM | POA: Diagnosis not present

## 2017-07-19 DIAGNOSIS — S62667A Nondisplaced fracture of distal phalanx of left little finger, initial encounter for closed fracture: Secondary | ICD-10-CM | POA: Diagnosis not present

## 2017-08-02 DIAGNOSIS — M1712 Unilateral primary osteoarthritis, left knee: Secondary | ICD-10-CM | POA: Diagnosis not present

## 2017-08-16 ENCOUNTER — Encounter: Payer: Self-pay | Admitting: Internal Medicine

## 2017-08-16 DIAGNOSIS — M20012 Mallet finger of left finger(s): Secondary | ICD-10-CM | POA: Diagnosis not present

## 2017-08-16 DIAGNOSIS — M810 Age-related osteoporosis without current pathological fracture: Secondary | ICD-10-CM | POA: Diagnosis not present

## 2017-08-16 DIAGNOSIS — M8588 Other specified disorders of bone density and structure, other site: Secondary | ICD-10-CM | POA: Diagnosis not present

## 2017-08-17 ENCOUNTER — Other Ambulatory Visit: Payer: Self-pay | Admitting: Internal Medicine

## 2017-08-17 MED ORDER — LEVOTHYROXINE SODIUM 25 MCG PO TABS: 25.0000 ug | ORAL_TABLET | Freq: Every day | ORAL | 3 refills | Status: DC

## 2017-08-17 MED ORDER — LORAZEPAM 0.5 MG PO TABS: 0.5000 mg | ORAL_TABLET | Freq: Every day | ORAL | 2 refills | Status: DC | PRN

## 2017-08-17 MED ORDER — ZOLPIDEM TARTRATE 10 MG PO TABS: ORAL_TABLET | ORAL | 2 refills | Status: DC

## 2017-10-04 DIAGNOSIS — D229 Melanocytic nevi, unspecified: Secondary | ICD-10-CM | POA: Diagnosis not present

## 2017-10-04 DIAGNOSIS — L821 Other seborrheic keratosis: Secondary | ICD-10-CM | POA: Diagnosis not present

## 2017-10-04 DIAGNOSIS — L814 Other melanin hyperpigmentation: Secondary | ICD-10-CM | POA: Diagnosis not present

## 2017-10-04 DIAGNOSIS — Z85828 Personal history of other malignant neoplasm of skin: Secondary | ICD-10-CM | POA: Diagnosis not present

## 2017-10-04 DIAGNOSIS — D1801 Hemangioma of skin and subcutaneous tissue: Secondary | ICD-10-CM | POA: Diagnosis not present

## 2017-10-04 DIAGNOSIS — L819 Disorder of pigmentation, unspecified: Secondary | ICD-10-CM | POA: Diagnosis not present

## 2017-10-04 DIAGNOSIS — L570 Actinic keratosis: Secondary | ICD-10-CM | POA: Diagnosis not present

## 2017-10-21 ENCOUNTER — Other Ambulatory Visit: Payer: Self-pay | Admitting: Internal Medicine

## 2017-10-21 NOTE — Telephone Encounter (Signed)
Okay for refill? Please advise 

## 2017-10-26 DIAGNOSIS — Z23 Encounter for immunization: Secondary | ICD-10-CM | POA: Diagnosis not present

## 2017-11-04 DIAGNOSIS — H04123 Dry eye syndrome of bilateral lacrimal glands: Secondary | ICD-10-CM | POA: Diagnosis not present

## 2017-11-04 DIAGNOSIS — H40013 Open angle with borderline findings, low risk, bilateral: Secondary | ICD-10-CM | POA: Diagnosis not present

## 2017-11-04 DIAGNOSIS — H401431 Capsular glaucoma with pseudoexfoliation of lens, bilateral, mild stage: Secondary | ICD-10-CM | POA: Diagnosis not present

## 2017-11-04 DIAGNOSIS — H2513 Age-related nuclear cataract, bilateral: Secondary | ICD-10-CM | POA: Diagnosis not present

## 2017-11-07 DIAGNOSIS — Z1231 Encounter for screening mammogram for malignant neoplasm of breast: Secondary | ICD-10-CM | POA: Diagnosis not present

## 2017-11-07 LAB — HM MAMMOGRAPHY

## 2017-11-10 ENCOUNTER — Encounter: Payer: Self-pay | Admitting: Internal Medicine

## 2018-03-23 ENCOUNTER — Ambulatory Visit (INDEPENDENT_AMBULATORY_CARE_PROVIDER_SITE_OTHER): Payer: Medicare Other | Admitting: Internal Medicine

## 2018-03-23 ENCOUNTER — Other Ambulatory Visit: Payer: Self-pay

## 2018-03-23 ENCOUNTER — Encounter: Payer: Self-pay | Admitting: Internal Medicine

## 2018-03-23 VITALS — BP 120/78 | HR 87 | Temp 98.4°F | Ht 62.0 in | Wt 155.5 lb

## 2018-03-23 DIAGNOSIS — G4700 Insomnia, unspecified: Secondary | ICD-10-CM | POA: Diagnosis not present

## 2018-03-23 DIAGNOSIS — M8580 Other specified disorders of bone density and structure, unspecified site: Secondary | ICD-10-CM

## 2018-03-23 DIAGNOSIS — E039 Hypothyroidism, unspecified: Secondary | ICD-10-CM | POA: Diagnosis not present

## 2018-03-23 MED ORDER — ZOLPIDEM TARTRATE 10 MG PO TABS: ORAL_TABLET | ORAL | 2 refills | Status: DC

## 2018-03-23 NOTE — Patient Instructions (Signed)
-  Nice meeting you today!!  -Schedule follow up after April for your medicare wellness visit with Raquel Sarna.  -Schedule follow up with me in 6 months.  -Consider shingles vaccine at pharmacy.

## 2018-03-23 NOTE — Progress Notes (Signed)
Established Patient Office Visit     CC/Reason for Visit: Establish care, follow-up chronic conditions  HPI: [name] is a 73 y.o. female who is coming in today for the above mentioned reasons. Past Medical History is significant for: Insomnia for which she takes Ambien/Ativan very infrequently, these are prescribed by her previous PCP, she has a history of hypothyroidism for which she takes 25 mcg of Synthroid daily.  She has been relatively healthy, had her annual visit in April 2019.  She has left knee osteoarthritis followed by Dr. Mayer Camel that she is nearing knee replacement.  She has no acute complaints today   Past Medical/Surgical History: Past Medical History:  Diagnosis Date  . Osteopenia     No past surgical history on file.  Social History:  reports that she quit smoking about 7 years ago. Her smoking use included cigarettes. She has a 15.00 pack-year smoking history. She has never used smokeless tobacco. She reports current alcohol use. She reports that she does not use drugs.  Allergies: No Known Allergies  Family History:  Family History  Problem Relation Age of Onset  . Heart disease Mother   . Arthritis Mother   . Hypertension Father   . Arthritis Father      Current Outpatient Medications:  .  levothyroxine (SYNTHROID, LEVOTHROID) 25 MCG tablet, Take 1 tablet (25 mcg total) by mouth daily before breakfast., Disp: 90 tablet, Rfl: 3 .  LORazepam (ATIVAN) 0.5 MG tablet, TAKE 1 TABLET BY MOUTH EVERY DAY AS NEEDED, Disp: 60 tablet, Rfl: 2 .  Multiple Vitamin (MULTIVITAMIN) tablet, Take 1 tablet by mouth daily., Disp: , Rfl:  .  naproxen sodium (ALEVE) 220 MG tablet, Take 220 mg by mouth., Disp: , Rfl:  .  Omega-3 Fatty Acids (FISH OIL) 1200 MG CAPS, Take 1 capsule by mouth daily., Disp: , Rfl:  .  Polyethyl Glycol-Propyl Glycol (SYSTANE) 0.4-0.3 % SOLN, Apply 1 drop to eye 3 (three) times daily., Disp: , Rfl:  .  vitamin C (ASCORBIC ACID) 500 MG  tablet, Take 500 mg by mouth daily., Disp: , Rfl:  .  vitamin E 400 UNIT capsule, Take 400 Units by mouth daily., Disp: , Rfl:  .  zolpidem (AMBIEN) 10 MG tablet, TAKE 1 TABLET AT BEDTIME AS NEEDED FOR SLEEP, Disp: 30 tablet, Rfl: 2  Review of Systems:  Constitutional: Denies fever, chills, diaphoresis, appetite change and fatigue.  HEENT: Denies photophobia, eye pain, redness, hearing loss, ear pain, congestion, sore throat, rhinorrhea, sneezing, mouth sores, trouble swallowing, neck pain, neck stiffness and tinnitus.   Respiratory: Denies SOB, DOE, cough, chest tightness,  and wheezing.   Cardiovascular: Denies chest pain, palpitations and leg swelling.  Gastrointestinal: Denies nausea, vomiting, abdominal pain, diarrhea, constipation, blood in stool and abdominal distention.  Genitourinary: Denies dysuria, urgency, frequency, hematuria, flank pain and difficulty urinating.  Endocrine: Denies: hot or cold intolerance, sweats, changes in hair or nails, polyuria, polydipsia. Musculoskeletal: Denies myalgias, back pain, joint swelling, arthralgias and gait problem.  Skin: Denies pallor, rash and wound.  Neurological: Denies dizziness, seizures, syncope, weakness, light-headedness, numbness and headaches.  Hematological: Denies adenopathy. Easy bruising, personal or family bleeding history  Psychiatric/Behavioral: Denies suicidal ideation, mood changes, confusion, nervousness, sleep disturbance and agitation    Physical Exam: Vitals:   03/23/18 1443  BP: 120/78  Pulse: 87  Temp: 98.4 F (36.9 C)  TempSrc: Oral  SpO2: 97%  Weight: 155 lb 8 oz (70.5 kg)  Height: 5\' 2"  (  1.575 m)    Body mass index is 28.44 kg/m.   Constitutional: NAD, calm, comfortable Eyes: PERRL, lids and conjunctivae normal ENMT: Mucous membranes are moist.  Respiratory: clear to auscultation bilaterally, no wheezing, no crackles. Normal respiratory effort. No accessory muscle use.  Cardiovascular: Regular rate  and rhythm, no murmurs / rubs / gallops. No extremity edema. 2+ pedal pulses. No carotid bruits.   Musculoskeletal: no clubbing / cyanosis. No joint deformity upper and lower extremities. Good ROM, no contractures. Normal muscle tone. Marland Kitchen  Psychiatric: Normal judgment and insight. Alert and oriented x 3. Normal mood.    Impression and Plan:  Acquired hypothyroidism -Last TSH was 3.040 in April 2019, continue current dose of Synthroid.  Insomnia, unspecified type -Will refill Ambien.  She uses this very sporadically.  Osteopenia, unspecified location -She states she had a DEXA scan in August 2019, will obtain records.    Patient Instructions  -Nice meeting you today!!  -Schedule follow up after April for your medicare wellness visit with Raquel Sarna.  -Schedule follow up with me in 6 months.  -Consider shingles vaccine at pharmacy.     [name], MD [facility]

## 2018-03-24 ENCOUNTER — Telehealth: Payer: Self-pay

## 2018-03-24 NOTE — Telephone Encounter (Signed)
Author phoned pt. to attempt to reschedule 6/12 AWV appointment. No answer, author left detailed VM asking for return call to schedule.

## 2018-05-09 ENCOUNTER — Other Ambulatory Visit: Payer: Self-pay

## 2018-05-09 ENCOUNTER — Ambulatory Visit: Payer: No Typology Code available for payment source | Admitting: Internal Medicine

## 2018-06-23 ENCOUNTER — Ambulatory Visit: Payer: Medicare Other

## 2018-08-08 ENCOUNTER — Other Ambulatory Visit: Payer: Self-pay

## 2018-08-08 ENCOUNTER — Encounter: Payer: Self-pay | Admitting: Internal Medicine

## 2018-08-08 ENCOUNTER — Ambulatory Visit (INDEPENDENT_AMBULATORY_CARE_PROVIDER_SITE_OTHER): Payer: Medicare Other | Admitting: Internal Medicine

## 2018-08-08 VITALS — BP 110/70 | HR 62 | Temp 97.7°F | Ht 62.0 in | Wt 150.3 lb

## 2018-08-08 DIAGNOSIS — E039 Hypothyroidism, unspecified: Secondary | ICD-10-CM

## 2018-08-08 DIAGNOSIS — H6122 Impacted cerumen, left ear: Secondary | ICD-10-CM | POA: Diagnosis not present

## 2018-08-08 DIAGNOSIS — Z0000 Encounter for general adult medical examination without abnormal findings: Secondary | ICD-10-CM

## 2018-08-08 DIAGNOSIS — M8580 Other specified disorders of bone density and structure, unspecified site: Secondary | ICD-10-CM

## 2018-08-08 DIAGNOSIS — G4700 Insomnia, unspecified: Secondary | ICD-10-CM

## 2018-08-08 LAB — CBC WITH DIFFERENTIAL/PLATELET
Basophils Absolute: 0.1 10*3/uL (ref 0.0–0.1)
Basophils Relative: 1.3 % (ref 0.0–3.0)
Eosinophils Absolute: 0.1 10*3/uL (ref 0.0–0.7)
Eosinophils Relative: 2.1 % (ref 0.0–5.0)
HCT: 41.7 % (ref 36.0–46.0)
Hemoglobin: 14.2 g/dL (ref 12.0–15.0)
Lymphocytes Relative: 39.3 % (ref 12.0–46.0)
Lymphs Abs: 1.8 10*3/uL (ref 0.7–4.0)
MCHC: 33.9 g/dL (ref 30.0–36.0)
MCV: 100.4 fl — ABNORMAL HIGH (ref 78.0–100.0)
Monocytes Absolute: 0.4 10*3/uL (ref 0.1–1.0)
Monocytes Relative: 8.3 % (ref 3.0–12.0)
Neutro Abs: 2.2 10*3/uL (ref 1.4–7.7)
Neutrophils Relative %: 49 % (ref 43.0–77.0)
Platelets: 292 10*3/uL (ref 150.0–400.0)
RBC: 4.16 Mil/uL (ref 3.87–5.11)
RDW: 12.8 % (ref 11.5–15.5)
WBC: 4.5 10*3/uL (ref 4.0–10.5)

## 2018-08-08 LAB — COMPREHENSIVE METABOLIC PANEL
ALT: 13 U/L (ref 0–35)
AST: 17 U/L (ref 0–37)
Albumin: 4.4 g/dL (ref 3.5–5.2)
Alkaline Phosphatase: 70 U/L (ref 39–117)
BUN: 11 mg/dL (ref 6–23)
CO2: 29 mEq/L (ref 19–32)
Calcium: 9.5 mg/dL (ref 8.4–10.5)
Chloride: 102 mEq/L (ref 96–112)
Creatinine, Ser: 0.67 mg/dL (ref 0.40–1.20)
GFR: 86.51 mL/min (ref >60.00)
Glucose, Bld: 80 mg/dL (ref 70–99)
Potassium: 4.4 mEq/L (ref 3.5–5.1)
Sodium: 138 mEq/L (ref 135–145)
Total Bilirubin: 0.6 mg/dL (ref 0.2–1.2)
Total Protein: 6.9 g/dL (ref 6.0–8.3)

## 2018-08-08 LAB — VITAMIN D 25 HYDROXY (VIT D DEFICIENCY, FRACTURES): VITD: 52.87 ng/mL (ref 30.00–100.00)

## 2018-08-08 LAB — TSH: TSH: 3.31 u[IU]/mL (ref 0.35–4.50)

## 2018-08-08 NOTE — Patient Instructions (Signed)
-Nice seeing you today!!  -Lab work today; will notify you once results are available.  -Make sure you get your shingles vaccination series at the pharmacy.   Preventive Care 72 Years and Older, Female Preventive care refers to lifestyle choices and visits with your health care provider that can promote health and wellness. This includes:  A yearly physical exam. This is also called an annual well check.  Regular dental and eye exams.  Immunizations.  Screening for certain conditions.  Healthy lifestyle choices, such as diet and exercise. What can I expect for my preventive care visit? Physical exam Your health care provider will check:  Height and weight. These may be used to calculate body mass index (BMI), which is a measurement that tells if you are at a healthy weight.  Heart rate and blood pressure.  Your skin for abnormal spots. Counseling Your health care provider may ask you questions about:  Alcohol, tobacco, and drug use.  Emotional well-being.  Home and relationship well-being.  Sexual activity.  Eating habits.  History of falls.  Memory and ability to understand (cognition).  Work and work environment.  Pregnancy and menstrual history. What immunizations do I need?  Influenza (flu) vaccine  This is recommended every year. Tetanus, diphtheria, and pertussis (Tdap) vaccine  You may need a Td booster every 10 years. Varicella (chickenpox) vaccine  You may need this vaccine if you have not already been vaccinated. Zoster (shingles) vaccine  You may need this after age 72. Pneumococcal conjugate (PCV13) vaccine  One dose is recommended after age 72. Pneumococcal polysaccharide (PPSV23) vaccine  One dose is recommended after age 72. Measles, mumps, and rubella (MMR) vaccine  You may need at least one dose of MMR if you were born in 1957 or later. You may also need a second dose. Meningococcal conjugate (MenACWY) vaccine  You may need  this if you have certain conditions. Hepatitis A vaccine  You may need this if you have certain conditions or if you travel or work in places where you may be exposed to hepatitis A. Hepatitis B vaccine  You may need this if you have certain conditions or if you travel or work in places where you may be exposed to hepatitis B. Haemophilus influenzae type b (Hib) vaccine  You may need this if you have certain conditions. You may receive vaccines as individual doses or as more than one vaccine together in one shot (combination vaccines). Talk with your health care provider about the risks and benefits of combination vaccines. What tests do I need? Blood tests  Lipid and cholesterol levels. These may be checked every 5 years, or more frequently depending on your overall health.  Hepatitis C test.  Hepatitis B test. Screening  Lung cancer screening. You may have this screening every year starting at age 55 if you have a 30-pack-year history of smoking and currently smoke or have quit within the past 15 years.  Colorectal cancer screening. All adults should have this screening starting at age 50 and continuing until age 75. Your health care provider may recommend screening at age 45 if you are at increased risk. You will have tests every 1-10 years, depending on your results and the type of screening test.  Diabetes screening. This is done by checking your blood sugar (glucose) after you have not eaten for a while (fasting). You may have this done every 1-3 years.  Mammogram. This may be done every 1-2 years. Talk with your health care provider   about how often you should have regular mammograms.  BRCA-related cancer screening. This may be done if you have a family history of breast, ovarian, tubal, or peritoneal cancers. Other tests  Sexually transmitted disease (STD) testing.  Bone density scan. This is done to screen for osteoporosis. You may have this done starting at age 72. Follow  these instructions at home: Eating and drinking  Eat a diet that includes fresh fruits and vegetables, whole grains, lean protein, and low-fat dairy products. Limit your intake of foods with high amounts of sugar, saturated fats, and salt.  Take vitamin and mineral supplements as recommended by your health care provider.  Do not drink alcohol if your health care provider tells you not to drink.  If you drink alcohol: ? Limit how much you have to 0-1 drink a day. ? Be aware of how much alcohol is in your drink. In the [place] one drink equals one 12 oz bottle of beer (355 mL), one 5 oz glass of wine (148 mL), or one 1 oz glass of hard liquor (44 mL). Lifestyle  Take daily care of your teeth and gums.  Stay active. Exercise for at least 30 minutes on 5 or more days each week.  Do not use any products that contain nicotine or tobacco, such as cigarettes, e-cigarettes, and chewing tobacco. If you need help quitting, ask your health care provider.  If you are sexually active, practice safe sex. Use a condom or other form of protection in order to prevent STIs (sexually transmitted infections).  Talk with your health care provider about taking a low-dose aspirin or statin. What's next?  Go to your health care provider once a year for a well check visit.  Ask your health care provider how often you should have your eyes and teeth checked.  Stay up to date on all vaccines. This information is not intended to replace advice given to you by your health care provider. Make sure you discuss any questions you have with your health care provider. Document Released: 01/24/2015 Document Revised: 12/22/2017 Document Reviewed: 12/22/2017 Elsevier Patient Education  2020 [employer]  

## 2018-08-08 NOTE — Progress Notes (Signed)
Established Patient Office Visit     CC/Reason for Visit: Subsequent Medicare wellness visit  HPI: [name] is a 73 y.o. female who is coming in today for the above mentioned reasons. Past Medical History is significant for: Insomnia for which she takes Ambien/Ativan very infrequently, these have been prescribed by her previous PCP (not requesting any refills today), she has a history of hypothyroidism for which she takes 25 mcg of Synthroid daily.   She has left knee osteoarthritis followed by Dr. Mayer Camel that she is nearing knee replacement.  She has no acute complaints today   Past Medical/Surgical History: Past Medical History:  Diagnosis Date  . Osteopenia     No past surgical history on file.  Social History:  reports that she quit smoking about 7 years ago. Her smoking use included cigarettes. She has a 15.00 pack-year smoking history. She has never used smokeless tobacco. She reports current alcohol use. She reports that she does not use drugs.  Allergies: No Known Allergies  Family History:  Family History  Problem Relation Age of Onset  . Heart disease Mother   . Arthritis Mother   . Hypertension Father   . Arthritis Father      Current Outpatient Medications:  .  levothyroxine (SYNTHROID, LEVOTHROID) 25 MCG tablet, Take 1 tablet (25 mcg total) by mouth daily before breakfast., Disp: 90 tablet, Rfl: 3 .  LORazepam (ATIVAN) 0.5 MG tablet, TAKE 1 TABLET BY MOUTH EVERY DAY AS NEEDED, Disp: 60 tablet, Rfl: 2 .  Multiple Vitamin (MULTIVITAMIN) tablet, Take 1 tablet by mouth daily., Disp: , Rfl:  .  naproxen sodium (ALEVE) 220 MG tablet, Take 220 mg by mouth., Disp: , Rfl:  .  Omega-3 Fatty Acids (FISH OIL) 1200 MG CAPS, Take 1 capsule by mouth daily., Disp: , Rfl:  .  Polyethyl Glycol-Propyl Glycol (SYSTANE) 0.4-0.3 % SOLN, Apply 1 drop to eye 3 (three) times daily., Disp: , Rfl:  .  vitamin C (ASCORBIC ACID) 500 MG tablet, Take 500 mg by mouth daily.,  Disp: , Rfl:  .  vitamin E 400 UNIT capsule, Take 400 Units by mouth daily., Disp: , Rfl:  .  zolpidem (AMBIEN) 10 MG tablet, TAKE 1 TABLET AT BEDTIME AS NEEDED FOR SLEEP, Disp: 30 tablet, Rfl: 2  Review of Systems:  Constitutional: Denies fever, chills, diaphoresis, appetite change and fatigue.  HEENT: Denies photophobia, eye pain, redness, hearing loss, ear pain, congestion, sore throat, rhinorrhea, sneezing, mouth sores, trouble swallowing, neck pain, neck stiffness and tinnitus.   Respiratory: Denies SOB, DOE, cough, chest tightness,  and wheezing.   Cardiovascular: Denies chest pain, palpitations and leg swelling.  Gastrointestinal: Denies nausea, vomiting, abdominal pain, diarrhea, constipation, blood in stool and abdominal distention.  Genitourinary: Denies dysuria, urgency, frequency, hematuria, flank pain and difficulty urinating.  Endocrine: Denies: hot or cold intolerance, sweats, changes in hair or nails, polyuria, polydipsia. Musculoskeletal: Denies myalgias, back pain, joint swelling, arthralgias and gait problem.  Skin: Denies pallor, rash and wound.  Neurological: Denies dizziness, seizures, syncope, weakness, light-headedness, numbness and headaches.  Hematological: Denies adenopathy. Easy bruising, personal or family bleeding history  Psychiatric/Behavioral: Denies suicidal ideation, mood changes, confusion, nervousness, sleep disturbance and agitation    Physical Exam: Vitals:   08/08/18 0938  BP: 110/70  Pulse: 62  Temp: 97.7 F (36.5 C)  TempSrc: Temporal  SpO2: 98%  Weight: 150 lb 4.8 oz (68.2 kg)  Height: 5' 2"  (1.575 m)    Body mass  index is 27.49 kg/m.   Constitutional: NAD, calm, comfortable Eyes: PERRL, lids and conjunctivae normal, wears corrective lenses ENMT: Mucous membranes are moist. Posterior pharynx clear of any exudate or lesions. Normal dentition. Tympanic membrane is pearly white, no erythema or bulging on the right, left is obstructed by  cerumen Neck: normal, supple, no masses, no thyromegaly Respiratory: clear to auscultation bilaterally, no wheezing, no crackles. Normal respiratory effort. No accessory muscle use.  Cardiovascular: Regular rate and rhythm, no murmurs / rubs / gallops. No extremity edema. 2+ pedal pulses. No carotid bruits.  Abdomen: no tenderness, no masses palpated. No hepatosplenomegaly. Bowel sounds positive.  Musculoskeletal: no clubbing / cyanosis. No joint deformity upper and lower extremities. Good ROM, no contractures. Normal muscle tone.  Skin: no rashes, lesions, ulcers. No induration Neurologic: CN 2-12 grossly intact. Sensation intact, DTR normal. Strength 5/5 in all 4.  Psychiatric: Normal judgment and insight. Alert and oriented x 3. Normal mood.   Subsequent Medicare wellness visit   1. Risk factors, based on past  M,S,F -cardiovascular disease risk factors include age only.   2.  Physical activities: She remains quite physically active, she does 1 hour of weight training 5 mornings a week, she also does all her housework and yard work.   3.  Depression/mood:  Mood is stable, not depressed   4.  Hearing:  Increased hearing difficulty out of left ear   5.  ADL's: Remains independent in all ADLs   6.  Fall risk:  Low fall risk   7.  Home safety: No problems identified   8.  Height weight, and visual acuity: Height and weight as above, visual acuity is 20/40 with the right eye, 20/50 with the left eye and 20/40 with both eyes   9.  Counseling:  Advised to continue eye exams once a year and dental exams twice a year, also advised to receive shingles vaccination at pharmacy   10. Lab orders based on risk factors: Laboratory update will be reviewed   11. Referral :  None today   12. Care plan:  Follow-up with me in 6 months   13. Cognitive assessment:  No cognitive impairment   14. Screening: Patient provided with a written and personalized 5-10 year screening schedule in the AVS.    yes   15. Provider List Update:   PCP, ophthalmologist (unknown provider)  16. Advance Directives: Remains full code     Office Visit from 08/08/2018 in [facility] [place]  PHQ-9 Total Score  1      Fall Risk  08/08/2018 03/23/2018 06/21/2017 06/16/2016 03/17/2016  Falls in the past year? 0 0 No No No  Comment - - fail in [place] in January  - -  Number falls in past yr: 0 0 - - -  Injury with Fall? 0 0 - - -     Impression and Plan:  Encounter for preventive health examination -Advised routine eye and dental care. -Immunizations are up-to-date with the exception of shingles which she will obtain at her local pharmacy. -Healthy lifestyle has been discussed in detail. -Screening labs to be requested today. -Colonoscopy in 2014, she is a 10-year callback. -Mammogram was negative in October 2019, redo this year. -She had her last Pap smear at age 2 and she has decided not to pursue this further.  She has a GYN.  Osteopenia, unspecified location -Advised at a minimum vitamin D 800 IUs daily and calcium 1200 mg daily. -Redo bone density in 2  years which will be August 2021.  Acquired hypothyroidism -Check TSH today, continue Synthroid  Insomnia, unspecified type -Uses Ambien/Ativan sporadically, she has not yet requested refills of this for me.  Left ear impacted cerumen -Cerumen Desimpaction  Warm water was applied and gentle ear lavage performed on left ear. There were no complications and following the desimpaction the tympanic membranes were visible. Tympanic membranes are intact following the procedure. Auditory canals are normal. The patient reported relief of symptoms after removal of cerumen.     Patient Instructions  -Nice seeing you today!!  -Lab work today; will notify you once results are available.  -Make sure you get your shingles vaccination series at the pharmacy.   Preventive Care 21 Years and Older, Female Preventive care refers to lifestyle  choices and visits with your health care provider that can promote health and wellness. This includes:  A yearly physical exam. This is also called an annual well check.  Regular dental and eye exams.  Immunizations.  Screening for certain conditions.  Healthy lifestyle choices, such as diet and exercise. What can I expect for my preventive care visit? Physical exam Your health care provider will check:  Height and weight. These may be used to calculate body mass index (BMI), which is a measurement that tells if you are at a healthy weight.  Heart rate and blood pressure.  Your skin for abnormal spots. Counseling Your health care provider may ask you questions about:  Alcohol, tobacco, and drug use.  Emotional well-being.  Home and relationship well-being.  Sexual activity.  Eating habits.  History of falls.  Memory and ability to understand (cognition).  Work and work Statistician.  Pregnancy and menstrual history. What immunizations do I need?  Influenza (flu) vaccine  This is recommended every year. Tetanus, diphtheria, and pertussis (Tdap) vaccine  You may need a Td booster every 10 years. Varicella (chickenpox) vaccine  You may need this vaccine if you have not already been vaccinated. Zoster (shingles) vaccine  You may need this after age 68. Pneumococcal conjugate (PCV13) vaccine  One dose is recommended after age 9. Pneumococcal polysaccharide (PPSV23) vaccine  One dose is recommended after age 65. Measles, mumps, and rubella (MMR) vaccine  You may need at least one dose of MMR if you were born in 1957 or later. You may also need a second dose. Meningococcal conjugate (MenACWY) vaccine  You may need this if you have certain conditions. Hepatitis A vaccine  You may need this if you have certain conditions or if you travel or work in places where you may be exposed to hepatitis A. Hepatitis B vaccine  You may need this if you have certain  conditions or if you travel or work in places where you may be exposed to hepatitis B. Haemophilus influenzae type b (Hib) vaccine  You may need this if you have certain conditions. You may receive vaccines as individual doses or as more than one vaccine together in one shot (combination vaccines). Talk with your health care provider about the risks and benefits of combination vaccines. What tests do I need? Blood tests  Lipid and cholesterol levels. These may be checked every 5 years, or more frequently depending on your overall health.  Hepatitis C test.  Hepatitis B test. Screening  Lung cancer screening. You may have this screening every year starting at age 31 if you have a 30-pack-year history of smoking and currently smoke or have quit within the past 15 years.  Colorectal cancer  screening. All adults should have this screening starting at age 27 and continuing until age 38. Your health care provider may recommend screening at age 78 if you are at increased risk. You will have tests every 1-10 years, depending on your results and the type of screening test.  Diabetes screening. This is done by checking your blood sugar (glucose) after you have not eaten for a while (fasting). You may have this done every 1-3 years.  Mammogram. This may be done every 1-2 years. Talk with your health care provider about how often you should have regular mammograms.  BRCA-related cancer screening. This may be done if you have a family history of breast, ovarian, tubal, or peritoneal cancers. Other tests  Sexually transmitted disease (STD) testing.  Bone density scan. This is done to screen for osteoporosis. You may have this done starting at age 67. Follow these instructions at home: Eating and drinking  Eat a diet that includes fresh fruits and vegetables, whole grains, lean protein, and low-fat dairy products. Limit your intake of foods with high amounts of sugar, saturated fats, and salt.   Take vitamin and mineral supplements as recommended by your health care provider.  Do not drink alcohol if your health care provider tells you not to drink.  If you drink alcohol: ? Limit how much you have to 0-1 drink a day. ? Be aware of how much alcohol is in your drink. In the [place] one drink equals one 12 oz bottle of beer (355 mL), one 5 oz glass of wine (148 mL), or one 1 oz glass of hard liquor (44 mL). Lifestyle  Take daily care of your teeth and gums.  Stay active. Exercise for at least 30 minutes on 5 or more days each week.  Do not use any products that contain nicotine or tobacco, such as cigarettes, e-cigarettes, and chewing tobacco. If you need help quitting, ask your health care provider.  If you are sexually active, practice safe sex. Use a condom or other form of protection in order to prevent STIs (sexually transmitted infections).  Talk with your health care provider about taking a low-dose aspirin or statin. What's next?  Go to your health care provider once a year for a well check visit.  Ask your health care provider how often you should have your eyes and teeth checked.  Stay up to date on all vaccines. This information is not intended to replace advice given to you by your health care provider. Make sure you discuss any questions you have with your health care provider. Document Released: 01/24/2015 Document Revised: 12/22/2017 Document Reviewed: 12/22/2017 Elsevier Patient Education  2020 [place], [place] [facility]

## 2018-09-05 ENCOUNTER — Other Ambulatory Visit: Payer: Self-pay | Admitting: *Deleted

## 2018-09-05 MED ORDER — LEVOTHYROXINE SODIUM 25 MCG PO TABS: 25.0000 ug | ORAL_TABLET | Freq: Every day | ORAL | 1 refills | Status: DC

## 2018-10-06 ENCOUNTER — Other Ambulatory Visit: Payer: Self-pay | Admitting: Internal Medicine

## 2018-10-06 DIAGNOSIS — G4700 Insomnia, unspecified: Secondary | ICD-10-CM

## 2018-10-10 DIAGNOSIS — M25562 Pain in left knee: Secondary | ICD-10-CM | POA: Diagnosis not present

## 2018-10-12 ENCOUNTER — Other Ambulatory Visit: Payer: Self-pay | Admitting: Orthopedic Surgery

## 2018-10-19 DIAGNOSIS — Z23 Encounter for immunization: Secondary | ICD-10-CM | POA: Diagnosis not present

## 2018-10-27 ENCOUNTER — Other Ambulatory Visit: Payer: Self-pay | Admitting: *Deleted

## 2018-10-27 MED ORDER — LORAZEPAM 0.5 MG PO TABS: 0.5000 mg | ORAL_TABLET | Freq: Every day | ORAL | 1 refills | Status: DC | PRN

## 2018-10-27 NOTE — Telephone Encounter (Signed)
Yes

## 2018-10-27 NOTE — Telephone Encounter (Signed)
Refill request [facility] [address]  Lorazepam 0.5 mg  Last refill 10/24/17 Last office visit 08/08/18  12. Care plan: Follow-up with me in 6 months  Okay to refill?

## 2018-10-30 ENCOUNTER — Other Ambulatory Visit: Payer: Self-pay | Admitting: Orthopedic Surgery

## 2018-10-30 NOTE — Care Plan (Signed)
Spoke with patient prior to surgery. She will discharge to home with family and HHPT. Referral to [facility] at [facility]. She has a rolling walker and 3n1 at home. She will transition to OPPT after follow up with MD in the office. Patient and MD in agreement with plan. Choice offered.   Ladell Heads, [place]

## 2018-10-30 NOTE — Patient Instructions (Signed)
DUE TO COVID-19 ONLY ONE VISITOR IS ALLOWED TO COME WITH YOU AND STAY IN THE WAITING ROOM ONLY DURING PRE OP AND PROCEDURE DAY OF SURGERY. THE 1 VISITOR MAY VISIT WITH YOU AFTER SURGERY IN YOUR PRIVATE ROOM DURING VISITING HOURS ONLY!  YOU NEED TO HAVE A COVID 19 TEST ON_10/22______ @_______ , THIS TEST MUST BE DONE BEFORE SURGERY, COME  [place]  , [id number].  ([place]) ONCE YOUR COVID TEST IS COMPLETED, PLEASE BEGIN THE QUARANTINE INSTRUCTIONS AS OUTLINED IN YOUR HANDOUT.                ATHINA OKEKE   Your procedure is scheduled on: 11/06/18   Report to [facility] Main  Entrance   Report to Short Stay at 5:30  AM     Call this number if you have problems the morning of surgery [phone]   . BRUSH YOUR TEETH MORNING OF SURGERY AND RINSE YOUR MOUTH OUT, NO CHEWING GUM CANDY OR MINTS.   Do not eat food After Midnight  . YOU MAY HAVE CLEAR LIQUIDS FROM MIDNIGHT UNTIL 4:30AM  . At 4:30AM Please finish the prescribed Pre-Surgery drink  . Nothing by mouth after you finish the  drink !   Take these medicines the morning of surgery with A SIP OF WATER: Levothyroxine                                 You may not have any metal on your body including piercings             Do not wear jewelry, make-up, lotions, powders or perfumes, deodorant             Do not wear nail polish on your fingernails.  Do not shave  48 hours prior to surgery.              Do not bring valuables to the hospital. [place].  Contacts, dentures or bridgework may not be worn into surgery.      Special Instructions: N/A              Please read over the following fact sheets you were given: _____________________________________________________________________             [facility] - Preparing for Surgery  Before surgery, you can play an important role.   Because skin is not sterile, your skin needs  to be as free of germs as possible.   You can reduce the number of germs on your skin by washing with CHG (chlorahexidine gluconate) soap before surgery.   CHG is an antiseptic cleaner which kills germs and bonds with the skin to continue killing germs even after washing. Please DO NOT use if you have an allergy to CHG or antibacterial soaps.   If your skin becomes reddened/irritated stop using the CHG and inform your nurse when you arrive at Short Stay. Do not shave (including legs and underarms) for at least 48 hours prior to the first CHG shower Please follow these instructions carefully:  1.  Shower with CHG Soap the night before surgery and the  morning of Surgery.  2.  If you choose to wash your hair, wash your hair first as usual with your  normal  shampoo.  3.  After you  shampoo, rinse your hair and body thoroughly to remove the  shampoo.                                        4.  Use CHG as you would any other liquid soap.  You can apply chg directly  to the skin and wash                       Gently with a scrungie or clean washcloth.  5.  Apply the CHG Soap to your body ONLY FROM THE NECK DOWN.   Do not use on face/ open                           Wound or open sores. Avoid contact with eyes, ears mouth and genitals (private parts).                       Wash face,  Genitals (private parts) with your normal soap.             6.  Wash thoroughly, paying special attention to the area where your surgery  will be performed.  7.  Thoroughly rinse your body with warm water from the neck down.  8.  DO NOT shower/wash with your normal soap after using and rinsing off  the CHG Soap.                9.  Pat yourself dry with a clean towel.            10.  Wear clean pajamas.            11.  Place clean sheets on your bed the night of your first shower and do not  sleep with pets. Day of Surgery : Do not apply any lotions/deodorants the morning of surgery.  Please wear clean clothes to the  hospital/surgery center.  FAILURE TO FOLLOW THESE INSTRUCTIONS MAY RESULT IN THE CANCELLATION OF YOUR SURGERY PATIENT SIGNATURE_________________________________  NURSE SIGNATURE__________________________________  ________________________________________________________________________   Adam Phenix  An incentive spirometer is a tool that can help keep your lungs clear and active. This tool measures how well you are filling your lungs with each breath. Taking long deep breaths may help reverse or decrease the chance of developing breathing (pulmonary) problems (especially infection) following:  A long period of time when you are unable to move or be active. BEFORE THE PROCEDURE   If the spirometer includes an indicator to show your best effort, your nurse or respiratory therapist will set it to a desired goal.  If possible, sit up straight or lean slightly forward. Try not to slouch.  Hold the incentive spirometer in an upright position. INSTRUCTIONS FOR USE  1. Sit on the edge of your bed if possible, or sit up as far as you can in bed or on a chair. 2. Hold the incentive spirometer in an upright position. 3. Breathe out normally. 4. Place the mouthpiece in your mouth and seal your lips tightly around it. 5. Breathe in slowly and as deeply as possible, raising the piston or the ball toward the top of the column. 6. Hold your breath for 3-5 seconds or for as long as possible. Allow the piston or ball to fall to the bottom of the column. 7. Remove the mouthpiece  from your mouth and breathe out normally. 8. Rest for a few seconds and repeat Steps 1 through 7 at least 10 times every 1-2 hours when you are awake. Take your time and take a few normal breaths between deep breaths. 9. The spirometer may include an indicator to show your best effort. Use the indicator as a goal to work toward during each repetition. 10. After each set of 10 deep breaths, practice coughing to be sure  your lungs are clear. If you have an incision (the cut made at the time of surgery), support your incision when coughing by placing a pillow or rolled up towels firmly against it. Once you are able to get out of bed, walk around indoors and cough well. You may stop using the incentive spirometer when instructed by your caregiver.  RISKS AND COMPLICATIONS  Take your time so you do not get dizzy or light-headed.  If you are in pain, you may need to take or ask for pain medication before doing incentive spirometry. It is harder to take a deep breath if you are having pain. AFTER USE  Rest and breathe slowly and easily.  It can be helpful to keep track of a log of your progress. Your caregiver can provide you with a simple table to help with this. If you are using the spirometer at home, follow these instructions: [place] IF:   You are having difficultly using the spirometer.  You have trouble using the spirometer as often as instructed.  Your pain medication is not giving enough relief while using the spirometer.  You develop fever of 100.5 F (38.1 C) or higher. SEEK IMMEDIATE MEDICAL CARE IF:   You cough up bloody sputum that had not been present before.  You develop fever of 102 F (38.9 C) or greater.  You develop worsening pain at or near the incision site. MAKE SURE YOU:   Understand these instructions.  Will watch your condition.  Will get help right away if you are not doing well or get worse. Document Released: 05/10/2006 Document Revised: 03/22/2011 Document Reviewed: 07/11/2006 [facility] Patient Information 2014 [place], [place].   ________________________________________________________________________

## 2018-10-31 ENCOUNTER — Other Ambulatory Visit: Payer: Self-pay

## 2018-10-31 ENCOUNTER — Ambulatory Visit (HOSPITAL_COMMUNITY)
Admission: RE | Admit: 2018-10-31 | Discharge: 2018-10-31 | Disposition: A | Discharge status: Home / Self Care | Payer: Medicare Other | Source: Ambulatory Visit | Attending: Orthopedic Surgery | Admitting: Orthopedic Surgery

## 2018-10-31 ENCOUNTER — Encounter (HOSPITAL_COMMUNITY): Payer: Self-pay

## 2018-10-31 ENCOUNTER — Encounter (HOSPITAL_COMMUNITY)
Admission: RE | Admit: 2018-10-31 | Discharge: 2018-10-31 | Disposition: A | Discharge status: Home / Self Care | Payer: Medicare Other | Source: Ambulatory Visit | Attending: Orthopedic Surgery | Admitting: Orthopedic Surgery

## 2018-10-31 DIAGNOSIS — Z79899 Other long term (current) drug therapy: Secondary | ICD-10-CM | POA: Insufficient documentation

## 2018-10-31 DIAGNOSIS — Z01818 Encounter for other preprocedural examination: Secondary | ICD-10-CM | POA: Insufficient documentation

## 2018-10-31 DIAGNOSIS — G4700 Insomnia, unspecified: Secondary | ICD-10-CM | POA: Insufficient documentation

## 2018-10-31 DIAGNOSIS — Z79890 Hormone replacement therapy (postmenopausal): Secondary | ICD-10-CM | POA: Diagnosis not present

## 2018-10-31 DIAGNOSIS — Z96659 Presence of unspecified artificial knee joint: Secondary | ICD-10-CM | POA: Diagnosis not present

## 2018-10-31 DIAGNOSIS — M1712 Unilateral primary osteoarthritis, left knee: Secondary | ICD-10-CM | POA: Diagnosis not present

## 2018-10-31 DIAGNOSIS — Z87891 Personal history of nicotine dependence: Secondary | ICD-10-CM | POA: Diagnosis not present

## 2018-10-31 DIAGNOSIS — E039 Hypothyroidism, unspecified: Secondary | ICD-10-CM | POA: Diagnosis not present

## 2018-10-31 HISTORY — DX: Unspecified osteoarthritis, unspecified site: M19.90

## 2018-10-31 HISTORY — DX: Anxiety disorder, unspecified: F41.9

## 2018-10-31 HISTORY — DX: Hypothyroidism, unspecified: E03.9

## 2018-10-31 LAB — BASIC METABOLIC PANEL
Anion gap: 8 (ref 5–15)
BUN: 12 mg/dL (ref 8–23)
CO2: 26 mmol/L (ref 22–32)
Calcium: 9 mg/dL (ref 8.9–10.3)
Chloride: 103 mmol/L (ref 98–111)
Creatinine, Ser: 0.66 mg/dL (ref 0.44–1.00)
GFR calc Af Amer: >60 mL/min (ref >60)
GFR calc non Af Amer: >60 mL/min (ref >60)
Glucose, Bld: 101 mg/dL — ABNORMAL HIGH (ref 70–99)
Potassium: 4.4 mmol/L (ref 3.5–5.1)
Sodium: 137 mmol/L (ref 135–145)

## 2018-10-31 LAB — URINALYSIS, ROUTINE W REFLEX MICROSCOPIC
Bilirubin Urine: NEGATIVE
Glucose, UA: NEGATIVE mg/dL
Hgb urine dipstick: NEGATIVE
Ketones, ur: NEGATIVE mg/dL
Leukocytes,Ua: NEGATIVE
Nitrite: NEGATIVE
Protein, ur: NEGATIVE mg/dL
Specific Gravity, Urine: 1.004 — ABNORMAL LOW (ref 1.005–1.030)
pH: 7 (ref 5.0–8.0)

## 2018-10-31 LAB — CBC WITH DIFFERENTIAL/PLATELET
Abs Immature Granulocytes: 0.02 10*3/uL (ref 0.00–0.07)
Basophils Absolute: 0.1 10*3/uL (ref 0.0–0.1)
Basophils Relative: 1 %
Eosinophils Absolute: 0.1 10*3/uL (ref 0.0–0.5)
Eosinophils Relative: 2 %
HCT: 44 % (ref 36.0–46.0)
Hemoglobin: 14.2 g/dL (ref 12.0–15.0)
Immature Granulocytes: 0 %
Lymphocytes Relative: 40 %
Lymphs Abs: 2.3 10*3/uL (ref 0.7–4.0)
MCH: 32.9 pg (ref 26.0–34.0)
MCHC: 32.3 g/dL (ref 30.0–36.0)
MCV: 101.9 fL — ABNORMAL HIGH (ref 80.0–100.0)
Monocytes Absolute: 0.5 10*3/uL (ref 0.1–1.0)
Monocytes Relative: 8 %
Neutro Abs: 2.7 10*3/uL (ref 1.7–7.7)
Neutrophils Relative %: 49 %
Platelets: 308 10*3/uL (ref 150–400)
RBC: 4.32 MIL/uL (ref 3.87–5.11)
RDW: 11.8 % (ref 11.5–15.5)
WBC: 5.7 10*3/uL (ref 4.0–10.5)
nRBC: 0 % (ref 0.0–0.2)

## 2018-10-31 LAB — PROTIME-INR
INR: 1 (ref 0.8–1.2)
Prothrombin Time: 12.7 seconds (ref 11.4–15.2)

## 2018-10-31 LAB — SURGICAL PCR SCREEN
MRSA, PCR: NEGATIVE
Staphylococcus aureus: NEGATIVE

## 2018-10-31 LAB — APTT: aPTT: 36 seconds (ref 24–36)

## 2018-10-31 LAB — ABO/RH: ABO/RH(D): O POS

## 2018-10-31 NOTE — Progress Notes (Signed)
PCP - Dr. Rockney Ghee Cardiologist - none  Chest x-ray - 10/31/18 EKG - 10/31/18 Stress Test - no ECHO - no Cardiac Cath - no  Sleep Study - no CPAP -   Fasting Blood Sugar - NA Checks Blood Sugar _____ times a day  Blood Thinner Instructions:NA Aspirin Instructions: Last Dose:  Anesthesia review:   Patient denies shortness of breath, fever, cough and chest pain at PAT appointment yes  Patient verbalized understanding of instructions that were given to them at the PAT appointment. Patient was also instructed that they will need to review over the PAT instructions again at home before surgery. yes

## 2018-11-02 ENCOUNTER — Other Ambulatory Visit (HOSPITAL_COMMUNITY)
Admission: RE | Admit: 2018-11-02 | Discharge: 2018-11-02 | Disposition: A | Discharge status: Home / Self Care | Payer: Medicare Other | Source: Ambulatory Visit | Attending: Orthopedic Surgery | Admitting: Orthopedic Surgery

## 2018-11-02 DIAGNOSIS — Z01812 Encounter for preprocedural laboratory examination: Secondary | ICD-10-CM | POA: Diagnosis not present

## 2018-11-02 DIAGNOSIS — Z20828 Contact with and (suspected) exposure to other viral communicable diseases: Secondary | ICD-10-CM | POA: Diagnosis not present

## 2018-11-03 ENCOUNTER — Encounter (HOSPITAL_COMMUNITY): Payer: Self-pay | Admitting: Anesthesiology

## 2018-11-03 DIAGNOSIS — M1712 Unilateral primary osteoarthritis, left knee: Secondary | ICD-10-CM | POA: Diagnosis present

## 2018-11-03 NOTE — H&P (Signed)
TOTAL KNEE ADMISSION H&P  Patient is being admitted for left total knee arthroplasty.  Subjective:  Chief Complaint:left knee pain.  HPI: [name], 73 y.o. female, has a history of pain and functional disability in the left knee due to arthritis and has failed non-surgical conservative treatments for greater than 12 weeks to includeNSAID's and/or analgesics, corticosteriod injections, viscosupplementation injections, use of assistive devices, weight reduction as appropriate and activity modification.  Onset of symptoms was gradual, starting several years ago with gradually worsening course since that time. The patient noted no past surgery on the left knee(s).  Patient currently rates pain in the left knee(s) at 10 out of 10 with activity. Patient has night pain, worsening of pain with activity and weight bearing, pain that interferes with activities of daily living, pain with passive range of motion, crepitus and joint swelling.  Patient has evidence of subchondral sclerosis, periarticular osteophytes, joint subluxation and joint space narrowing by imaging studies.  There is no active infection.  Patient Active Problem List   Diagnosis Date Noted  . Insomnia 03/23/2018  . Hypothyroidism 04/28/2017  . Osteopenia 02/03/2007   Past Medical History:  Diagnosis Date  . Anxiety   . Arthritis    Knees,back,  . Hypothyroidism   . Osteopenia     Past Surgical History:  Procedure Laterality Date  . COLONOSCOPY  2014    No current facility-administered medications for this encounter.    Current Outpatient Medications  Medication Sig Dispense Refill Last Dose  . levothyroxine (SYNTHROID) 25 MCG tablet Take 1 tablet (25 mcg total) by mouth daily before breakfast. 90 tablet 1   . Multiple Vitamin (MULTIVITAMIN) tablet Take 1 tablet by mouth daily.     . naproxen sodium (ALEVE) 220 MG tablet Take 220 mg by mouth.     . Omega-3 Fatty Acids (FISH OIL) 1200 MG CAPS Take 1 capsule by mouth  daily.     Vladimir Faster Glycol-Propyl Glycol (SYSTANE) 0.4-0.3 % SOLN Apply 1 drop to eye 3 (three) times daily.     . vitamin C (ASCORBIC ACID) 500 MG tablet Take 500 mg by mouth daily.     . vitamin E 1000 UNIT capsule Take 1,000 Units by mouth daily.      Marland Kitchen zolpidem (AMBIEN) 10 MG tablet TAKE 1 TABLET BY MOUTH EVERY DAY AT BEDTIME AS NEEDED FOR SLEEP 30 tablet 2   . LORazepam (ATIVAN) 0.5 MG tablet Take 1 tablet (0.5 mg total) by mouth daily as needed for anxiety. 90 tablet 1    No Known Allergies  Social History   Tobacco Use  . Smoking status: Former Smoker    Packs/day: 0.50    Years: 30.00    Pack years: 15.00    Types: Cigarettes    Quit date: 03/12/2011    Years since quitting: 7.6  . Smokeless tobacco: Never Used  Substance Use Topics  . Alcohol use: Yes    Comment: 2 glasses of wine per day    Family History  Problem Relation Age of Onset  . Heart disease Mother   . Arthritis Mother   . Hypertension Father   . Arthritis Father      Review of Systems  Constitutional: Negative.   HENT: Negative.   Eyes: Negative.   Respiratory: Negative.   Cardiovascular: Negative.   Gastrointestinal: Negative.   Genitourinary: Negative.   Musculoskeletal: Positive for joint pain.  Skin: Negative.   Neurological: Negative.   Endo/Heme/Allergies: Negative.   Psychiatric/Behavioral: The  patient has insomnia.     Objective:  Physical Exam  Constitutional: She is oriented to person, place, and time. She appears well-developed and well-nourished.  HENT:  Head: Normocephalic and atraumatic.  Neck: Normal range of motion. Neck supple.  Cardiovascular: Intact distal pulses.  Respiratory: Effort normal.  Musculoskeletal:        General: Tenderness present.     Comments: Diffuse swelling of medial compartment.  TTP at medial joint line.  Strength and range of motion are intact.  Negative varus and valgus stresses.  Negative Lachman.  Positive Thessaly's.  Neurological: She is  alert and oriented to person, place, and time.  Skin: Skin is warm and dry.  Psychiatric: She has a normal mood and affect. Her behavior is normal. Judgment and thought content normal.    Vital signs in last 24 hours:    Labs:   Estimated body mass index is 27 kg/m as calculated from the following:   Height as of 10/31/18: 5' 2.5" (1.588 m).   Weight as of 10/31/18: 68 kg.   Imaging Review Left knee demonstrates bone-on-bone medial end-stage osteoarthritis with slight shift of left tibia compared to the 2014 x-ray with new sclerotic and bony osteophytes visualized.   Assessment/Plan:  End stage arthritis, left knee   The patient history, physical examination, clinical judgment of the provider and imaging studies are consistent with end stage degenerative joint disease of the left knee(s) and total knee arthroplasty is deemed medically necessary. The treatment options including medical management, injection therapy arthroscopy and arthroplasty were discussed at length. The risks and benefits of total knee arthroplasty were presented and reviewed. The risks due to aseptic loosening, infection, stiffness, patella tracking problems, thromboembolic complications and other imponderables were discussed. The patient acknowledged the explanation, agreed to proceed with the plan and consent was signed. Patient is being admitted for inpatient treatment for surgery, pain control, PT, OT, prophylactic antibiotics, VTE prophylaxis, progressive ambulation and ADL's and discharge planning. The patient is planning to be discharged home with home health services     Patient's anticipated LOS is less than 2 midnights, meeting these requirements: - Younger than 47 - Lives within 1 hour of care - Has a competent adult at home to recover with post-op recover - NO history of  - Chronic pain requiring opiods  - Diabetes  - Coronary Artery Disease  - Heart failure  - Heart attack  - Stroke  -  DVT/VTE  - Cardiac arrhythmia  - Respiratory Failure/COPD  - Renal failure  - Anemia  - Advanced Liver disease

## 2018-11-05 LAB — NOVEL CORONAVIRUS, NAA (HOSP ORDER, SEND-OUT TO REF LAB; TAT 18-24 HRS): SARS-CoV-2, NAA: NOT DETECTED

## 2018-11-05 MED ORDER — TRANEXAMIC ACID 1000 MG/10ML IV SOLN
2000.0000 mg | INTRAVENOUS | Status: DC
  Filled 2018-11-05: qty 20

## 2018-11-05 MED ORDER — BUPIVACAINE LIPOSOME 1.3 % IJ SUSP
20.0000 mL | Freq: Once | INTRAMUSCULAR | Status: DC
  Filled 2018-11-05: qty 20

## 2018-11-06 ENCOUNTER — Ambulatory Visit (HOSPITAL_COMMUNITY)
Admission: RE | Admit: 2018-11-06 | Discharge: 2018-11-07 | Disposition: A | Discharge status: Home / Self Care | Payer: Medicare Other | Source: Other Acute Inpatient Hospital | Attending: Orthopedic Surgery | Admitting: Orthopedic Surgery

## 2018-11-06 ENCOUNTER — Other Ambulatory Visit: Payer: Self-pay

## 2018-11-06 ENCOUNTER — Encounter (HOSPITAL_COMMUNITY): Payer: Self-pay

## 2018-11-06 ENCOUNTER — Ambulatory Visit (HOSPITAL_COMMUNITY): Payer: Medicare Other | Admitting: Anesthesiology

## 2018-11-06 ENCOUNTER — Encounter (HOSPITAL_COMMUNITY)
Admission: RE | Disposition: A | Discharge status: Home / Self Care | Payer: Self-pay | Source: Other Acute Inpatient Hospital | Attending: Orthopedic Surgery

## 2018-11-06 DIAGNOSIS — M8580 Other specified disorders of bone density and structure, unspecified site: Secondary | ICD-10-CM | POA: Diagnosis not present

## 2018-11-06 DIAGNOSIS — F419 Anxiety disorder, unspecified: Secondary | ICD-10-CM | POA: Diagnosis not present

## 2018-11-06 DIAGNOSIS — M1712 Unilateral primary osteoarthritis, left knee: Secondary | ICD-10-CM | POA: Diagnosis present

## 2018-11-06 DIAGNOSIS — Z79890 Hormone replacement therapy (postmenopausal): Secondary | ICD-10-CM | POA: Diagnosis not present

## 2018-11-06 DIAGNOSIS — Z87891 Personal history of nicotine dependence: Secondary | ICD-10-CM | POA: Insufficient documentation

## 2018-11-06 DIAGNOSIS — G4700 Insomnia, unspecified: Secondary | ICD-10-CM | POA: Diagnosis not present

## 2018-11-06 DIAGNOSIS — G8918 Other acute postprocedural pain: Secondary | ICD-10-CM | POA: Diagnosis not present

## 2018-11-06 DIAGNOSIS — Z96652 Presence of left artificial knee joint: Secondary | ICD-10-CM

## 2018-11-06 DIAGNOSIS — E039 Hypothyroidism, unspecified: Secondary | ICD-10-CM | POA: Diagnosis not present

## 2018-11-06 DIAGNOSIS — Z79899 Other long term (current) drug therapy: Secondary | ICD-10-CM | POA: Diagnosis not present

## 2018-11-06 HISTORY — PX: TOTAL KNEE ARTHROPLASTY: SHX125

## 2018-11-06 LAB — TYPE AND SCREEN
ABO/RH(D): O POS
Antibody Screen: NEGATIVE

## 2018-11-06 SURGERY — ARTHROPLASTY, KNEE, TOTAL
Anesthesia: Spinal | Site: Knee | Laterality: Left

## 2018-11-06 MED ORDER — TRANEXAMIC ACID-NACL 1000-0.7 MG/100ML-% IV SOLN
1000.0000 mg | INTRAVENOUS | Status: AC
  Administered 2018-11-06: 07:00:00 1000 mg via INTRAVENOUS
  Filled 2018-11-06: qty 100

## 2018-11-06 MED ORDER — ONDANSETRON HCL 4 MG/2ML IJ SOLN: 4.0000 mg | Freq: Four times a day (QID) | INTRAMUSCULAR | Status: DC | PRN

## 2018-11-06 MED ORDER — DEXAMETHASONE SODIUM PHOSPHATE 10 MG/ML IJ SOLN
INTRAMUSCULAR | Status: DC | PRN
  Administered 2018-11-06: 8 mg via INTRAVENOUS

## 2018-11-06 MED ORDER — ONDANSETRON HCL 4 MG/2ML IJ SOLN
INTRAMUSCULAR | Status: AC
  Filled 2018-11-06: qty 6

## 2018-11-06 MED ORDER — POLYVINYL ALCOHOL 1.4 % OP SOLN
1.0000 [drp] | Freq: Three times a day (TID) | OPHTHALMIC | Status: DC
  Administered 2018-11-06 – 2018-11-07 (×4): 1 [drp] via OPHTHALMIC
  Filled 2018-11-06: qty 15

## 2018-11-06 MED ORDER — DOCUSATE SODIUM 100 MG PO CAPS
100.0000 mg | ORAL_CAPSULE | Freq: Two times a day (BID) | ORAL | Status: DC
  Administered 2018-11-06 – 2018-11-07 (×2): 100 mg via ORAL
  Filled 2018-11-06 (×2): qty 1

## 2018-11-06 MED ORDER — METHOCARBAMOL 500 MG PO TABS
500.0000 mg | ORAL_TABLET | Freq: Four times a day (QID) | ORAL | Status: DC | PRN
  Administered 2018-11-06 – 2018-11-07 (×4): 500 mg via ORAL
  Filled 2018-11-06 (×4): qty 1

## 2018-11-06 MED ORDER — MIDAZOLAM HCL 2 MG/2ML IJ SOLN
INTRAMUSCULAR | Status: AC
  Filled 2018-11-06: qty 2

## 2018-11-06 MED ORDER — POVIDONE-IODINE 10 % EX SWAB: 2.0000 "application " | Freq: Once | CUTANEOUS | Status: DC

## 2018-11-06 MED ORDER — HYDROMORPHONE HCL 1 MG/ML IJ SOLN
0.5000 mg | INTRAMUSCULAR | Status: DC | PRN
  Administered 2018-11-06: 0.5 mg via INTRAVENOUS
  Filled 2018-11-06: qty 1

## 2018-11-06 MED ORDER — ONDANSETRON HCL 4 MG PO TABS: 4.0000 mg | ORAL_TABLET | Freq: Four times a day (QID) | ORAL | Status: DC | PRN

## 2018-11-06 MED ORDER — METOCLOPRAMIDE HCL 5 MG PO TABS: 5.0000 mg | ORAL_TABLET | Freq: Three times a day (TID) | ORAL | Status: DC | PRN

## 2018-11-06 MED ORDER — DIPHENHYDRAMINE HCL 12.5 MG/5ML PO ELIX: 12.5000 mg | ORAL_SOLUTION | ORAL | Status: DC | PRN

## 2018-11-06 MED ORDER — BUPIVACAINE IN DEXTROSE 0.75-8.25 % IT SOLN
INTRATHECAL | Status: DC | PRN
  Administered 2018-11-06: 2 mL via INTRATHECAL

## 2018-11-06 MED ORDER — SODIUM CHLORIDE 0.9 % IV SOLN
INTRAVENOUS | Status: DC | PRN
  Administered 2018-11-06: 07:00:00 35 ug/min via INTRAVENOUS

## 2018-11-06 MED ORDER — BUPIVACAINE HCL (PF) 0.25 % IJ SOLN
INTRAMUSCULAR | Status: AC
  Filled 2018-11-06: qty 30

## 2018-11-06 MED ORDER — CEFAZOLIN SODIUM-DEXTROSE 2-4 GM/100ML-% IV SOLN
2.0000 g | INTRAVENOUS | Status: AC
  Administered 2018-11-06: 07:00:00 2 g via INTRAVENOUS
  Filled 2018-11-06: qty 100

## 2018-11-06 MED ORDER — TIZANIDINE HCL 2 MG PO TABS: 2.0000 mg | ORAL_TABLET | Freq: Four times a day (QID) | ORAL | 0 refills | Status: DC | PRN

## 2018-11-06 MED ORDER — GABAPENTIN 300 MG PO CAPS
300.0000 mg | ORAL_CAPSULE | Freq: Three times a day (TID) | ORAL | Status: DC
  Administered 2018-11-06 – 2018-11-07 (×3): 300 mg via ORAL
  Filled 2018-11-06 (×3): qty 1

## 2018-11-06 MED ORDER — OXYCODONE HCL 5 MG PO TABS
5.0000 mg | ORAL_TABLET | ORAL | Status: DC | PRN
  Administered 2018-11-06: 5 mg via ORAL
  Administered 2018-11-06 – 2018-11-07 (×4): 10 mg via ORAL
  Filled 2018-11-06: qty 1
  Filled 2018-11-06 (×4): qty 2

## 2018-11-06 MED ORDER — GLYCOPYRROLATE PF 0.2 MG/ML IJ SOSY
PREFILLED_SYRINGE | INTRAMUSCULAR | Status: AC
  Filled 2018-11-06: qty 1

## 2018-11-06 MED ORDER — BUPIVACAINE LIPOSOME 1.3 % IJ SUSP
INTRAMUSCULAR | Status: DC | PRN
  Administered 2018-11-06: 20 mL

## 2018-11-06 MED ORDER — METOCLOPRAMIDE HCL 5 MG/ML IJ SOLN: 5.0000 mg | Freq: Three times a day (TID) | INTRAMUSCULAR | Status: DC | PRN

## 2018-11-06 MED ORDER — CHLORHEXIDINE GLUCONATE 4 % EX LIQD: 60.0000 mL | Freq: Once | CUTANEOUS | Status: DC

## 2018-11-06 MED ORDER — PROPOFOL 10 MG/ML IV BOLUS
INTRAVENOUS | Status: AC
  Filled 2018-11-06: qty 20

## 2018-11-06 MED ORDER — DEXAMETHASONE SODIUM PHOSPHATE 10 MG/ML IJ SOLN
INTRAMUSCULAR | Status: AC
  Filled 2018-11-06: qty 3

## 2018-11-06 MED ORDER — METHOCARBAMOL 500 MG IVPB - SIMPLE MED
500.0000 mg | Freq: Four times a day (QID) | INTRAVENOUS | Status: DC | PRN
  Administered 2018-11-06: 10:00:00 500 mg via INTRAVENOUS
  Filled 2018-11-06: qty 50

## 2018-11-06 MED ORDER — BISACODYL 5 MG PO TBEC: 5.0000 mg | DELAYED_RELEASE_TABLET | Freq: Every day | ORAL | Status: DC | PRN

## 2018-11-06 MED ORDER — PROPOFOL 500 MG/50ML IV EMUL
INTRAVENOUS | Status: DC | PRN
  Administered 2018-11-06: 75 ug/kg/min via INTRAVENOUS

## 2018-11-06 MED ORDER — FENTANYL CITRATE (PF) 100 MCG/2ML IJ SOLN
INTRAMUSCULAR | Status: AC
  Filled 2018-11-06: qty 2

## 2018-11-06 MED ORDER — POLYETHYLENE GLYCOL 3350 17 G PO PACK: 17.0000 g | PACK | Freq: Every day | ORAL | Status: DC | PRN

## 2018-11-06 MED ORDER — PHENOL 1.4 % MT LIQD: 1.0000 | OROMUCOSAL | Status: DC | PRN

## 2018-11-06 MED ORDER — ASPIRIN 81 MG PO CHEW
81.0000 mg | CHEWABLE_TABLET | Freq: Two times a day (BID) | ORAL | Status: DC
  Administered 2018-11-06 – 2018-11-07 (×2): 81 mg via ORAL
  Filled 2018-11-06 (×2): qty 1

## 2018-11-06 MED ORDER — ZOLPIDEM TARTRATE 5 MG PO TABS: 5.0000 mg | ORAL_TABLET | Freq: Every evening | ORAL | Status: DC | PRN

## 2018-11-06 MED ORDER — MIDAZOLAM HCL 5 MG/5ML IJ SOLN
INTRAMUSCULAR | Status: DC | PRN
  Administered 2018-11-06: 2 mg via INTRAVENOUS
  Administered 2018-11-06: 1 mg via INTRAVENOUS

## 2018-11-06 MED ORDER — SODIUM CHLORIDE (PF) 0.9 % IJ SOLN
INTRAMUSCULAR | Status: AC
  Filled 2018-11-06: qty 100

## 2018-11-06 MED ORDER — PROPOFOL 500 MG/50ML IV EMUL
INTRAVENOUS | Status: AC
  Filled 2018-11-06: qty 150

## 2018-11-06 MED ORDER — FLEET ENEMA 7-19 GM/118ML RE ENEM: 1.0000 | ENEMA | Freq: Once | RECTAL | Status: DC | PRN

## 2018-11-06 MED ORDER — HYDROMORPHONE HCL 1 MG/ML IJ SOLN: 0.2500 mg | INTRAMUSCULAR | Status: DC | PRN

## 2018-11-06 MED ORDER — GLYCOPYRROLATE PF 0.2 MG/ML IJ SOSY
PREFILLED_SYRINGE | INTRAMUSCULAR | Status: DC | PRN
  Administered 2018-11-06: .2 mg via INTRAVENOUS

## 2018-11-06 MED ORDER — FENTANYL CITRATE (PF) 100 MCG/2ML IJ SOLN
INTRAMUSCULAR | Status: DC | PRN
  Administered 2018-11-06: 100 ug via INTRAVENOUS

## 2018-11-06 MED ORDER — POLYETHYL GLYCOL-PROPYL GLYCOL 0.4-0.3 % OP SOLN: 1.0000 [drp] | Freq: Three times a day (TID) | OPHTHALMIC | Status: DC

## 2018-11-06 MED ORDER — PROPOFOL 10 MG/ML IV BOLUS
INTRAVENOUS | Status: DC | PRN
  Administered 2018-11-06: 30 mg via INTRAVENOUS

## 2018-11-06 MED ORDER — ALUM & MAG HYDROXIDE-SIMETH 200-200-20 MG/5ML PO SUSP: 30.0000 mL | ORAL | Status: DC | PRN

## 2018-11-06 MED ORDER — WATER FOR IRRIGATION, STERILE IR SOLN
Status: DC | PRN
  Administered 2018-11-06: 2000 mL

## 2018-11-06 MED ORDER — DEXAMETHASONE SODIUM PHOSPHATE 10 MG/ML IJ SOLN
10.0000 mg | Freq: Once | INTRAMUSCULAR | Status: AC
  Administered 2018-11-07: 10:00:00 10 mg via INTRAVENOUS
  Filled 2018-11-06: qty 1

## 2018-11-06 MED ORDER — PANTOPRAZOLE SODIUM 40 MG PO TBEC
40.0000 mg | DELAYED_RELEASE_TABLET | Freq: Every day | ORAL | Status: DC
  Administered 2018-11-06 – 2018-11-07 (×2): 40 mg via ORAL
  Filled 2018-11-06 (×2): qty 1

## 2018-11-06 MED ORDER — MEPERIDINE HCL 50 MG/ML IJ SOLN: 6.2500 mg | INTRAMUSCULAR | Status: DC | PRN

## 2018-11-06 MED ORDER — ROPIVACAINE HCL 7.5 MG/ML IJ SOLN
INTRAMUSCULAR | Status: DC | PRN
  Administered 2018-11-06: 20 mL via PERINEURAL

## 2018-11-06 MED ORDER — KCL IN DEXTROSE-NACL 20-5-0.45 MEQ/L-%-% IV SOLN
INTRAVENOUS | Status: DC
  Administered 2018-11-06 – 2018-11-07 (×3): via INTRAVENOUS
  Filled 2018-11-06 (×4): qty 1000

## 2018-11-06 MED ORDER — ACETAMINOPHEN 325 MG PO TABS: 325.0000 mg | ORAL_TABLET | Freq: Four times a day (QID) | ORAL | Status: DC | PRN

## 2018-11-06 MED ORDER — OXYCODONE-ACETAMINOPHEN 5-325 MG PO TABS: 1.0000 | ORAL_TABLET | ORAL | 0 refills | Status: DC | PRN

## 2018-11-06 MED ORDER — ONDANSETRON HCL 4 MG/2ML IJ SOLN
INTRAMUSCULAR | Status: DC | PRN
  Administered 2018-11-06: 4 mg via INTRAVENOUS

## 2018-11-06 MED ORDER — TRANEXAMIC ACID-NACL 1000-0.7 MG/100ML-% IV SOLN
1000.0000 mg | Freq: Once | INTRAVENOUS | Status: AC
  Administered 2018-11-06: 1000 mg via INTRAVENOUS
  Filled 2018-11-06: qty 100

## 2018-11-06 MED ORDER — METHOCARBAMOL 500 MG IVPB - SIMPLE MED
INTRAVENOUS | Status: AC
  Administered 2018-11-06: 500 mg via INTRAVENOUS
  Filled 2018-11-06: qty 50

## 2018-11-06 MED ORDER — MENTHOL 3 MG MT LOZG: 1.0000 | LOZENGE | OROMUCOSAL | Status: DC | PRN

## 2018-11-06 MED ORDER — BUPIVACAINE HCL (PF) 0.25 % IJ SOLN
INTRAMUSCULAR | Status: DC | PRN
  Administered 2018-11-06: 30 mL

## 2018-11-06 MED ORDER — SODIUM CHLORIDE (PF) 0.9 % IJ SOLN
INTRAMUSCULAR | Status: DC | PRN
  Administered 2018-11-06: 70 mL

## 2018-11-06 MED ORDER — ONDANSETRON HCL 4 MG/2ML IJ SOLN: 4.0000 mg | Freq: Once | INTRAMUSCULAR | Status: DC | PRN

## 2018-11-06 MED ORDER — SODIUM CHLORIDE 0.9 % IR SOLN
Status: DC | PRN
  Administered 2018-11-06: 1000 mL

## 2018-11-06 MED ORDER — LEVOTHYROXINE SODIUM 25 MCG PO TABS
25.0000 ug | ORAL_TABLET | Freq: Every day | ORAL | Status: DC
  Administered 2018-11-07: 06:00:00 25 ug via ORAL
  Filled 2018-11-06: qty 1

## 2018-11-06 MED ORDER — LORAZEPAM 0.5 MG PO TABS: 0.5000 mg | ORAL_TABLET | Freq: Every day | ORAL | Status: DC | PRN

## 2018-11-06 MED ORDER — LACTATED RINGERS IV SOLN
INTRAVENOUS | Status: DC
  Administered 2018-11-06 (×2): via INTRAVENOUS

## 2018-11-06 MED ORDER — ASPIRIN EC 81 MG PO TBEC: 81.0000 mg | DELAYED_RELEASE_TABLET | Freq: Two times a day (BID) | ORAL | 0 refills | Status: DC

## 2018-11-06 SURGICAL SUPPLY — 53 items
ATTUNE PS FEM LT SZ 6 CEM KNEE (Femur) ×1 IMPLANT
ATTUNE PSRP INSR SZ6 5 KNEE (Insert) ×1 IMPLANT
BAG DECANTER FOR FLEXI CONT (MISCELLANEOUS) ×2 IMPLANT
BAG SPEC THK2 15X12 ZIP CLS (MISCELLANEOUS) ×1
BAG ZIPLOCK 12X15 (MISCELLANEOUS) ×2 IMPLANT
BASE TIBIA ATTUNE KNEE SYS SZ6 (Knees) IMPLANT
BLADE SAG 18X100X1.27 (BLADE) ×2 IMPLANT
BLADE SAW SGTL 11.0X1.19X90.0M (BLADE) ×2 IMPLANT
BLADE SURG SZ10 CARB STEEL (BLADE) ×4 IMPLANT
BNDG CMPR MED 10X6 ELC LF (GAUZE/BANDAGES/DRESSINGS) ×1
BNDG ELASTIC 6X10 VLCR STRL LF (GAUZE/BANDAGES/DRESSINGS) ×2 IMPLANT
BNDG ELASTIC 6X5.8 VLCR STR LF (GAUZE/BANDAGES/DRESSINGS) ×1 IMPLANT
BOWL SMART MIX CTS (DISPOSABLE) ×2 IMPLANT
BSPLAT TIB 6 CMNT ROT PLAT STR (Knees) ×1 IMPLANT
CEMENT HV SMART SET (Cement) ×4 IMPLANT
COVER SURGICAL LIGHT HANDLE (MISCELLANEOUS) ×2 IMPLANT
COVER WAND RF STERILE (DRAPES) IMPLANT
CUFF TOURN SGL QUICK 34 (TOURNIQUET CUFF) ×2
CUFF TRNQT CYL 34X4.125X (TOURNIQUET CUFF) ×1 IMPLANT
DECANTER SPIKE VIAL GLASS SM (MISCELLANEOUS) ×6 IMPLANT
DRAPE U-SHAPE 47X51 STRL (DRAPES) ×2 IMPLANT
DRSG AQUACEL AG ADV 3.5X10 (GAUZE/BANDAGES/DRESSINGS) ×2 IMPLANT
DURAPREP 26ML APPLICATOR (WOUND CARE) ×2 IMPLANT
ELECT REM PT RETURN 15FT ADLT (MISCELLANEOUS) ×2 IMPLANT
GLOVE BIO SURGEON STRL SZ7.5 (GLOVE) ×2 IMPLANT
GLOVE BIO SURGEON STRL SZ8.5 (GLOVE) ×2 IMPLANT
GLOVE BIOGEL PI IND STRL 8 (GLOVE) ×1 IMPLANT
GLOVE BIOGEL PI IND STRL 9 (GLOVE) ×1 IMPLANT
GLOVE BIOGEL PI INDICATOR 8 (GLOVE) ×1
GLOVE BIOGEL PI INDICATOR 9 (GLOVE) ×1
GOWN STRL REUS W/TWL XL LVL3 (GOWN DISPOSABLE) ×4 IMPLANT
HANDPIECE INTERPULSE COAX TIP (DISPOSABLE) ×2
HOOD PEEL AWAY FLYTE STAYCOOL (MISCELLANEOUS) ×6 IMPLANT
KIT TURNOVER KIT A (KITS) IMPLANT
NDL HYPO 21X1.5 SAFETY (NEEDLE) ×2 IMPLANT
NEEDLE HYPO 21X1.5 SAFETY (NEEDLE) ×4 IMPLANT
NS IRRIG 1000ML POUR BTL (IV SOLUTION) ×2 IMPLANT
PACK ICE MAXI GEL EZY WRAP (MISCELLANEOUS) ×2 IMPLANT
PACK TOTAL KNEE CUSTOM (KITS) ×2 IMPLANT
PATELLA MEDIAL ATTUN 35MM KNEE (Knees) ×1 IMPLANT
PIN DRILL FIX HALF THREAD (BIT) ×1 IMPLANT
PIN STEINMAN FIXATION KNEE (PIN) ×1 IMPLANT
PROTECTOR NERVE ULNAR (MISCELLANEOUS) ×2 IMPLANT
SET HNDPC FAN SPRY TIP SCT (DISPOSABLE) ×1 IMPLANT
SUT VIC AB 1 CTX 36 (SUTURE) ×2
SUT VIC AB 1 CTX36XBRD ANBCTR (SUTURE) ×1 IMPLANT
SUT VIC AB 3-0 CT1 27 (SUTURE) ×6
SUT VIC AB 3-0 CT1 TAPERPNT 27 (SUTURE) ×3 IMPLANT
SYR CONTROL 10ML LL (SYRINGE) ×4 IMPLANT
TIBIA ATTUNE KNEE SYS BASE SZ6 (Knees) ×2 IMPLANT
TRAY FOLEY MTR SLVR 14FR STAT (SET/KITS/TRAYS/PACK) ×1 IMPLANT
WATER STERILE IRR 1000ML POUR (IV SOLUTION) ×4 IMPLANT
YANKAUER SUCT BULB TIP 10FT TU (MISCELLANEOUS) ×2 IMPLANT

## 2018-11-06 NOTE — Evaluation (Signed)
Physical Therapy Evaluation Patient Details Name: [name] MRN: [id number] DOB: 1945/03/11 Today's Date: 11/06/2018   History of Present Illness  s/p L TKA  Clinical Impression  Pt is s/p TKA resulting in the deficits listed below (see PT Problem List).  Pt amb 80' with min/guard assist. Tolerated well, pain controlled. Anticipate steady progress in acute setting. Pt is hopeful to d/c tomorrow. Pt's dtr is a PT and can do her pm PT session  Pt will benefit from skilled PT to increase their independence and safety with mobility to allow discharge to the venue listed below.      Follow Up Recommendations Follow surgeon's recommendation for DC plan and follow-up therapies    Equipment Recommendations  None recommended by PT    Recommendations for Other Services       Precautions / Restrictions Precautions Precautions: Knee Restrictions Weight Bearing Restrictions: No Other Position/Activity Restrictions: WBAT      Mobility  Bed Mobility Overal bed mobility: Needs Assistance Bed Mobility: Supine to Sit     Supine to sit: Min guard     General bed mobility comments: for safety  Transfers Overall transfer level: Needs assistance Equipment used: Rolling walker (2 wheeled) Transfers: Sit to/from Stand Sit to Stand: Min guard         General transfer comment: cues for hand placement and LLE position  Ambulation/Gait Ambulation/Gait assistance: Min guard;Min assist Gait Distance (Feet): 80 Feet Assistive device: Rolling walker (2 wheeled) Gait Pattern/deviations: Step-to pattern;Decreased stance time - left     General Gait Details: cues for sequence initially, for safety with turns. pt demonstrates good carryover  Stairs            Wheelchair Mobility    Modified Rankin (Stroke Patients Only)       Balance                                             Pertinent Vitals/Pain Pain Assessment: 0-10 Pain Location: L  knee Pain Descriptors / Indicators: Grimacing Pain Intervention(s): Limited activity within patient's tolerance;Monitored during session;Repositioned;Ice applied    Home Living Family/patient expects to be discharged to:: Private residence Living Arrangements: Spouse/significant other;Children   Type of Home: House Home Access: Stairs to enter   [employer] of Steps: 1 small step Home Layout: One level Home Equipment: Environmental consultant - 2 wheels;Bedside commode      Prior Function Level of Independence: Independent               Hand Dominance        Extremity/Trunk Assessment   Upper Extremity Assessment Upper Extremity Assessment: Overall WFL for tasks assessed    Lower Extremity Assessment Lower Extremity Assessment: LLE deficits/detail LLE Deficits / Details: ankle WFL; knee and hip grossly 3/5       Communication   Communication: No difficulties  Cognition Arousal/Alertness: Awake/alert Behavior During Therapy: WFL for tasks assessed/performed Overall Cognitive Status: Within Functional Limits for tasks assessed                                        General Comments      Exercises Total Joint Exercises Ankle Circles/Pumps: AROM;Both;10 reps Quad Sets: 10 reps;Both;AROM   Assessment/Plan    PT Assessment Patient needs continued PT  services  PT Problem List Decreased strength;Decreased range of motion;Decreased activity tolerance;Decreased mobility;Pain;Decreased knowledge of use of DME       PT Treatment Interventions DME instruction;Therapeutic exercise;Gait training;Functional mobility training;Therapeutic activities;Patient/family education    PT Goals (Current goals can be found in the Care Plan section)  Acute Rehab PT Goals Time For Goal Achievement: 11/13/18 Potential to Achieve Goals: Good    Frequency 7X/week   Barriers to discharge        Co-evaluation               AM-PAC PT "6 Clicks" Mobility   Outcome Measure Help needed turning from your back to your side while in a flat bed without using bedrails?: A Little Help needed moving from lying on your back to sitting on the side of a flat bed without using bedrails?: A Little Help needed moving to and from a bed to a chair (including a wheelchair)?: A Little Help needed standing up from a chair using your arms (e.g., wheelchair or bedside chair)?: A Little Help needed to walk in hospital room?: A Little Help needed climbing 3-5 steps with a railing? : A Little 6 Click Score: 18    End of Session Equipment Utilized During Treatment: Gait belt Activity Tolerance: Patient tolerated treatment well Patient left: in chair;with call bell/phone within reach;with chair alarm set;with family/visitor present Nurse Communication: Mobility status PT Visit Diagnosis: Difficulty in walking, not elsewhere classified (R26.2)    Time: 1350-1430 PT Time Calculation (min) (ACUTE ONLY): 40 min   Charges:   PT Evaluation $PT Eval Low Complexity: 1 Low PT Treatments $Gait Training: 23-37 mins        Kenyon Ana, PT  Pager: [phone] Acute Rehab Dept [facility]): [id number]   11/06/2018 \  Chassity Ludke 11/06/2018, 2:43 PM

## 2018-11-06 NOTE — Care Plan (Signed)
Ortho Bundle Case Management Note  Patient Details  Name: [name] MRN: [id number] Date of Birth: [birth year]   Spoke with patient prior to surgery. She will discharge to home with family and HHPT. Referral to [facility]. She has a rolling walker and 3n1 at home. She will transition to OPPT after follow up with MD in the office. Patient and MD in agreement with plan. Choice offered.                   DME Arranged:    DME Agency:     HH Arranged:  PT HH Agency:  [facility] (formerly [facility])  Additional Comments: Please contact me with any questions of if this plan should need to change.  Ladell Heads,  [place] [facility]  [phone] 11/06/2018, 9:04 AM

## 2018-11-06 NOTE — Plan of Care (Signed)
Plan of care for post op day 0 discussed with patient and daughter. All questions answered.   Will continue to monitor patient.    [place], Therapist, sports

## 2018-11-06 NOTE — Anesthesia Preprocedure Evaluation (Addendum)
Anesthesia Evaluation  Patient identified by MRN, date of birth, ID band Patient awake    Reviewed: Allergy & Precautions, NPO status , Patient's Chart, lab work & pertinent test results  Airway Mallampati: I  TM Distance: >3 FB Neck ROM: Full    Dental   Pulmonary former smoker,    Pulmonary exam normal        Cardiovascular Normal cardiovascular exam     Neuro/Psych Anxiety    GI/Hepatic   Endo/Other    Renal/GU      Musculoskeletal   Abdominal   Peds  Hematology   Anesthesia Other Findings   Reproductive/Obstetrics                            Anesthesia Physical Anesthesia Plan  ASA: II  Anesthesia Plan: Spinal   Post-op Pain Management:  Regional for Post-op pain   Induction: Intravenous  PONV Risk Score and Plan: 2 and Ondansetron and Midazolam  Airway Management Planned: Simple Face Mask  Additional Equipment:   Intra-op Plan:   Post-operative Plan:   Informed Consent: I have reviewed the patients History and Physical, chart, labs and discussed the procedure including the risks, benefits and alternatives for the proposed anesthesia with the patient or authorized representative who has indicated his/her understanding and acceptance.       Plan Discussed with: CRNA and Surgeon  Anesthesia Plan Comments:         Anesthesia Quick Evaluation

## 2018-11-06 NOTE — Op Note (Signed)
PATIENT ID:      [name]  MRN:     [id number] DOB/AGE:    [birth year] / 73 y.o.       OPERATIVE REPORT   DATE OF PROCEDURE:  11/06/2018      PREOPERATIVE DIAGNOSIS:   LEFT KNEE OSTEOARTHRITIS      Estimated body mass index is 27 kg/m as calculated from the following:   Height as of this encounter: 5' 2.5" (1.588 m).   Weight as of this encounter: 68 kg.                                                       POSTOPERATIVE DIAGNOSIS:   LEFT KNEE OSTEOARTHRITIS                                                                       PROCEDURE:  Procedure(s): LEFT TOTAL KNEE ARTHROPLASTY Using DepuyAttune RP implants #6L Femur, #6Tibia, 5 mm Attune RP bearing, 35 Patella    SURGEON: Kerin Salen  ASSISTANT:   Kerry Hough. [employer]   (Present and scrubbed throughout the case, critical for assistance with exposure, retraction, instrumentation, and closure.)        ANESTHESIA: soinal, 20cc Exparel, 50cc 0.25% Marcaine EBL: 300 cc FLUID REPLACEMENT: 1500 cc crystaloid TOURNIQUET: DRAINS: None TRANEXAMIC ACID: 1gm IV, 2gm topical COMPLICATIONS:  None         INDICATIONS FOR PROCEDURE: The patient has  LEFT KNEE OSTEOARTHRITIS, Var deformities, XR shows bone on bone arthritis, lateral subluxation of tibia. Patient has failed all conservative measures including anti-inflammatory medicines, narcotics, attempts at exercise and weight loss, cortisone injections and viscosupplementation.  Risks and benefits of surgery have been discussed, questions answered.   DESCRIPTION OF PROCEDURE: The patient identified by armband, received  IV antibiotics, in the holding area at [facility]. Patient taken to the operating room, appropriate anesthetic monitors were attached, and Spinal anesthesia was  induced. IV Tranexamic acid was given.Tourniquet applied high to the operative thigh. Lateral post and foot positioner applied to the table, the lower extremity was then prepped and draped in usual  sterile fashion from the toes to the tourniquet. Time-out procedure was performed. The skin and subcutaneous tissue along the incision was injected with 20 cc of a mixture of Exparel and Marcaine solution, using a 20-gauge by 1-1/2 inch needle. We began the operation, with the knee flexed 130 degrees, by making the anterior midline incision starting at handbreadth above the patella going over the patella 1 cm medial to and 4 cm distal to the tibial tubercle. Small bleeders in the skin and the subcutaneous tissue identified and cauterized. Transverse retinaculum was incised and reflected medially and a medial parapatellar arthrotomy was accomplished. the patella was everted and theprepatellar fat pad resected. The superficial medial collateral ligament was then elevated from anterior to posterior along the proximal flare of the tibia and anterior half of the menisci resected. The knee was hyperflexed exposing bone on bone arthritis. Peripheral and notch osteophytes as well as the cruciate ligaments were then resected. We  continued to work our way around posteriorly along the proximal tibia, and externally rotated the tibia subluxing it out from underneath the femur. A McHale PCL retractor was placed through the notch and a lateral Hohmann retractor placed, and we then entered the proximal tibia in line with the Depuy starter drill in line with the axis of the tibia followed by an intramedullary guide rod and 0-degree posterior slope cutting guide. The tibial cutting guide, 4 degree posterior sloped, was pinned into place allowing resection of 0 mm of bone medially and 10 mm of bone laterally. Satisfied with the tibial resection, we then entered the distal femur 2 mm anterior to the PCL origin with the intramedullary guide rod and applied the distal femoral cutting guide set at 9 mm, with 5 degrees of valgus. This was pinned along the epicondylar axis. At this point, the distal femoral cut was accomplished without  difficulty. We then sized for a #6L femoral component and pinned the guide in 3 degrees of external rotation. The chamfer cutting guide was pinned into place. The anterior, posterior, and chamfer cuts were accomplished without difficulty followed by the Attune RP box cutting guide and the box cut. We also removed posterior osteophytes from the posterior femoral condyles. The posterior capsule was injected with Exparel solution. The knee was brought into full extension. We checked our extension gap and fit a 5 mm bearing. Distracting in extension with a lamina spreader,  bleeders in the posterior capsule, Posterior medial and posterior lateral gutter were cauterized.  The transexamic acid-soaked sponge was then placed in the gap of the knee in extension. The knee was flexed 30. The posterior patella cut was accomplished with the 9.5 mm Attune cutting guide, sized for a 38mm dome, and the fixation pegs drilled.The knee was then once again hyperflexed exposing the proximal tibia. We sized for a # 6 tibial base plate, applied the smokestack and the conical reamer followed by the the Delta fin keel punch. We then hammered into place the Attune RP trial femoral component, drilled the lugs, inserted a  5 mm trial bearing, trial patellar button, and took the knee through range of motion from 0-130 degrees. Medial and lateral ligamentous stability was checked. No thumb pressure was required for patellar Tracking. The tourniquet was not used. All trial components were removed, mating surfaces irrigated with pulse lavage, and dried with suction and sponges. 10 cc of the Exparel solution was applied to the cancellus bone of the patella distal femur and proximal tibia.  After waiting 30 seconds, the bony surfaces were again, dried with sponges. A double batch of DePuy HV cement was mixed and applied to all bony metallic mating surfaces except for the posterior condyles of the femur itself. In order, we hammered into place the  tibial tray and removed excess cement, the femoral component and removed excess cement. The final Attune RP bearing was inserted, and the knee brought to full extension with compression. The patellar button was clamped into place, and excess cement removed. The knee was held at 30 flexion with compression, while the cement cured. The wound was irrigated out with normal saline solution pulse lavage. The rest of the Exparel was injected into the parapatellar arthrotomy, subcutaneous tissues, and periosteal tissues. The parapatellar arthrotomy was closed with running #1 Vicryl suture. The subcutaneous tissue with 0 and 2-0 undyed Vicryl suture, and the skin with running 3-0 SQ vicryl. An Aquacil and Ace wrap were applied. The patient was taken to recovery room  without difficulty.   Kerin Salen 11/06/2018, 8:46 AM

## 2018-11-06 NOTE — Discharge Instructions (Signed)

## 2018-11-06 NOTE — Transfer of Care (Signed)
Immediate Anesthesia Transfer of Care Note  Patient: [name]  Procedure(s) Performed: Procedure(s): LEFT TOTAL KNEE ARTHROPLASTY (Left)  Patient Location: PACU  Anesthesia Type:Spinal  Level of Consciousness:  sedated, patient cooperative and responds to stimulation  Airway & Oxygen Therapy:Patient Spontanous Breathing and Patient connected to face mask oxgen  Post-op Assessment:  Report given to PACU RN and Post -op Vital signs reviewed and stable  Post vital signs:  Reviewed and stable  Last Vitals:  Vitals:   11/06/18 0631  BP: 134/78  Pulse: 87  Resp: 15  Temp: 37 C  SpO2: [id number]    Complications: No apparent anesthesia complications

## 2018-11-06 NOTE — Interval H&P Note (Signed)
History and Physical Interval Note:  11/06/2018 7:11 AM  [name]  has presented today for surgery, with the diagnosis of LEFT KNEE OSTEOARTHRITIS.  The various methods of treatment have been discussed with the patient and family. After consideration of risks, benefits and other options for treatment, the patient has consented to  Procedure(s): LEFT TOTAL KNEE ARTHROPLASTY (Left) as a surgical intervention.  The patient's history has been reviewed, patient examined, no change in status, stable for surgery.  I have reviewed the patient's chart and labs.  Questions were answered to the patient's satisfaction.     Kerin Salen

## 2018-11-06 NOTE — Anesthesia Postprocedure Evaluation (Signed)
Anesthesia Post Note  Patient: [name]  Procedure(s) Performed: LEFT TOTAL KNEE ARTHROPLASTY (Left Knee)     Patient location during evaluation: PACU Anesthesia Type: Spinal Level of consciousness: oriented and awake and alert Pain management: pain level controlled Vital Signs Assessment: post-procedure vital signs reviewed and stable Respiratory status: spontaneous breathing, respiratory function stable and patient connected to nasal cannula oxygen Cardiovascular status: blood pressure returned to baseline and stable Postop Assessment: no headache, no backache and no apparent nausea or vomiting Anesthetic complications: no    Last Vitals:  Vitals:   11/06/18 0945 11/06/18 1003  BP: 114/71 131/79  Pulse: 61 68  Resp: 13 16  Temp: (!) 36.4 C   SpO2: 100% 100%    Last Pain:  Vitals:   11/06/18 0930  TempSrc:   PainSc: 0-No pain                 Tavarus Poteete DAVID

## 2018-11-06 NOTE — Anesthesia Procedure Notes (Signed)
Anesthesia Regional Block: Adductor canal block   Pre-Anesthetic Checklist: ,, timeout performed, Correct Patient, Correct Site, Correct Laterality, Correct Procedure, Correct Position, site marked, Risks and benefits discussed,  Surgical consent,  Pre-op evaluation,  At surgeon's request and post-op pain management  Laterality: Left  Prep: chloraprep       Needles:  Injection technique: Single-shot  Needle Type: Echogenic Stimulator Needle     Needle Length: 9cm  Needle Gauge: 21     Additional Needles:   Narrative:  Start time: 11/06/2018 7:02 AM End time: 11/06/2018 7:12 AM Injection made incrementally with aspirations every 5 mL.  Performed by: Personally  Anesthesiologist: Lillia Abed, MD  Additional Notes: Monitors applied. Patient sedated. Sterile prep and drape,hand hygiene and sterile gloves were used. Relevant anatomy identified.Needle position confirmed.Local anesthetic injected incrementally after negative aspiration. Local anesthetic spread visualized around nerve(s). Vascular puncture avoided. No complications. Image printed for medical record.The patient tolerated the procedure well.    Lillia Abed MD

## 2018-11-06 NOTE — Anesthesia Procedure Notes (Signed)
Spinal  Patient location during procedure: OR Start time: 11/06/2018 7:17 AM End time: 11/06/2018 7:20 AM Staffing Anesthesiologist: Lillia Abed, MD Performed: anesthesiologist  Preanesthetic Checklist Completed: patient identified, surgical consent, pre-op evaluation, timeout performed, IV checked, risks and benefits discussed and monitors and equipment checked Spinal Block Patient position: sitting Prep: DuraPrep Patient monitoring: blood pressure, continuous pulse ox, cardiac monitor and heart rate Approach: right paramedian Location: L3-4 Needle Needle type: Pencan  Needle gauge: 24 G Needle length: 9 cm Needle insertion depth: 5 cm

## 2018-11-06 NOTE — Anesthesia Procedure Notes (Signed)
Spinal

## 2018-11-07 ENCOUNTER — Encounter (HOSPITAL_COMMUNITY): Payer: Self-pay | Admitting: Orthopedic Surgery

## 2018-11-07 DIAGNOSIS — M1712 Unilateral primary osteoarthritis, left knee: Secondary | ICD-10-CM | POA: Diagnosis not present

## 2018-11-07 DIAGNOSIS — Z87891 Personal history of nicotine dependence: Secondary | ICD-10-CM | POA: Diagnosis not present

## 2018-11-07 DIAGNOSIS — M8580 Other specified disorders of bone density and structure, unspecified site: Secondary | ICD-10-CM | POA: Diagnosis not present

## 2018-11-07 DIAGNOSIS — E039 Hypothyroidism, unspecified: Secondary | ICD-10-CM | POA: Diagnosis not present

## 2018-11-07 DIAGNOSIS — F419 Anxiety disorder, unspecified: Secondary | ICD-10-CM | POA: Diagnosis not present

## 2018-11-07 DIAGNOSIS — G4700 Insomnia, unspecified: Secondary | ICD-10-CM | POA: Diagnosis not present

## 2018-11-07 LAB — CBC
HCT: 34.1 % — ABNORMAL LOW (ref 36.0–46.0)
Hemoglobin: 11 g/dL — ABNORMAL LOW (ref 12.0–15.0)
MCH: 33.1 pg (ref 26.0–34.0)
MCHC: 32.3 g/dL (ref 30.0–36.0)
MCV: 102.7 fL — ABNORMAL HIGH (ref 80.0–100.0)
Platelets: 233 10*3/uL (ref 150–400)
RBC: 3.32 MIL/uL — ABNORMAL LOW (ref 3.87–5.11)
RDW: 11.7 % (ref 11.5–15.5)
WBC: 8.6 10*3/uL (ref 4.0–10.5)
nRBC: 0 % (ref 0.0–0.2)

## 2018-11-07 LAB — BASIC METABOLIC PANEL
Anion gap: 6 (ref 5–15)
BUN: 8 mg/dL (ref 8–23)
CO2: 25 mmol/L (ref 22–32)
Calcium: 8.4 mg/dL — ABNORMAL LOW (ref 8.9–10.3)
Chloride: 103 mmol/L (ref 98–111)
Creatinine, Ser: 0.65 mg/dL (ref 0.44–1.00)
GFR calc Af Amer: >60 mL/min (ref >60)
GFR calc non Af Amer: >60 mL/min (ref >60)
Glucose, Bld: 184 mg/dL — ABNORMAL HIGH (ref 70–99)
Potassium: 4.2 mmol/L (ref 3.5–5.1)
Sodium: 134 mmol/L — ABNORMAL LOW (ref 135–145)

## 2018-11-07 NOTE — Plan of Care (Signed)
  Problem: Education: Goal: Knowledge of the prescribed therapeutic regimen will improve 11/07/2018 0453 by Sheilah Mins, RN Outcome: Progressing 11/07/2018 0017 by Sheilah Mins, RN Outcome: Progressing   Problem: Activity: Goal: Ability to avoid complications of mobility impairment will improve 11/07/2018 0453 by Sheilah Mins, RN Outcome: Progressing 11/07/2018 0017 by Sheilah Mins, RN Outcome: Progressing   Problem: Activity: Goal: Range of joint motion will improve 11/07/2018 0453 by Sheilah Mins, RN Outcome: Progressing 11/07/2018 0017 by Sheilah Mins, RN Outcome: Progressing   Problem: Pain Management: Goal: Pain level will decrease with appropriate interventions 11/07/2018 0453 by Sheilah Mins, RN Outcome: Progressing 11/07/2018 0017 by Sheilah Mins, RN Outcome: Progressing

## 2018-11-07 NOTE — Progress Notes (Signed)
Reviewed d/c instructions w/ patient and family. Verbalized understanding of all instructions. All questions answered. Patient ready to d/c home after lunch. Will cont to monitor until d/c.

## 2018-11-07 NOTE — Discharge Summary (Signed)
Patient ID: [name] MRN: [id number] DOB/AGE: 07-02-1945 73 y.o.  Admit date: 11/06/2018 Discharge date: 11/07/2018  Admission Diagnoses:  Principal Problem:   Degenerative arthritis of left knee Active Problems:   S/P total knee arthroplasty, left   Discharge Diagnoses:  Same  Past Medical History:  Diagnosis Date  . Anxiety   . Arthritis    Knees,back,  . Hypothyroidism   . Osteopenia     Surgeries: Procedure(s): LEFT TOTAL KNEE ARTHROPLASTY on 11/06/2018   Consultants:   Discharged Condition: Improved  Hospital Course: [name] is an 73 y.o. female who was admitted 11/06/2018 for operative treatment ofDegenerative arthritis of left knee. Patient has severe unremitting pain that affects sleep, daily activities, and work/hobbies. After pre-op clearance the patient was taken to the operating room on 11/06/2018 and underwent  Procedure(s): LEFT TOTAL KNEE ARTHROPLASTY.    Patient was given perioperative antibiotics:  Anti-infectives (From admission, onward)   Start     Dose/Rate Route Frequency Ordered Stop   11/06/18 0600  ceFAZolin (ANCEF) IVPB 2g/100 mL premix     2 g 200 mL/hr over 30 Minutes Intravenous On call to O.R. 11/06/18 [id number] 11/06/18 [id number]       Patient was given sequential compression devices, early ambulation, and chemoprophylaxis to prevent DVT.  Patient benefited maximally from hospital stay and there were no complications.    Recent vital signs:  Patient Vitals for the past 24 hrs:  BP Temp Temp src Pulse Resp SpO2  11/07/18 0528 107/62 98.4 F (36.9 C) - 85 17 100 %  11/07/18 0133 135/71 98.2 F (36.8 C) - 71 16 100 %  11/06/18 2138 102/66 98.4 F (36.9 C) Oral 75 20 99 %  11/06/18 1714 125/71 98 F (36.7 C) Oral 68 20 100 %  11/06/18 1308 115/70 97.6 F (36.4 C) - 67 16 100 %  11/06/18 1204 100/73 97.6 F (36.4 C) Oral 74 18 100 %  11/06/18 1101 106/60 98 F (36.7 C) - 87 17 100 %  11/06/18 1003 131/79 97.6 F  (36.4 C) Oral 68 16 100 %  11/06/18 0945 114/71 (!) 97.5 F (36.4 C) - 61 13 100 %  11/06/18 0930 121/72 - - 71 18 96 %  11/06/18 0915 (!) 155/76 - - 75 14 100 %  11/06/18 0900 105/72 (!) 97.5 F (36.4 C) - 82 16 100 %     Recent laboratory studies:  Recent Labs    11/07/18 0229  WBC 8.6  HGB 11.0*  HCT 34.1*  PLT 233  NA 134*  K 4.2  CL 103  CO2 25  BUN 8  CREATININE 0.65  GLUCOSE 184*  CALCIUM 8.4*     Discharge Medications:   Allergies as of 11/07/2018   No Known Allergies     Medication List    STOP taking these medications   naproxen sodium 220 MG tablet Commonly known as: ALEVE     TAKE these medications   aspirin EC 81 MG tablet Take 1 tablet (81 mg total) by mouth 2 (two) times daily.   Fish Oil 1200 MG Caps Take 1 capsule by mouth daily.   levothyroxine 25 MCG tablet Commonly known as: SYNTHROID Take 1 tablet (25 mcg total) by mouth daily before breakfast.   LORazepam 0.5 MG tablet Commonly known as: ATIVAN Take 1 tablet (0.5 mg total) by mouth daily as needed for anxiety.   multivitamin tablet Take 1 tablet by mouth daily.   oxyCODONE-acetaminophen 5-325 MG  tablet Commonly known as: PERCOCET/ROXICET Take 1 tablet by mouth every 4 (four) hours as needed for severe pain.   Systane 0.4-0.3 % Soln Generic drug: Polyethyl Glycol-Propyl Glycol Apply 1 drop to eye 3 (three) times daily.   tiZANidine 2 MG tablet Commonly known as: ZANAFLEX Take 1 tablet (2 mg total) by mouth every 6 (six) hours as needed.   vitamin C 500 MG tablet Commonly known as: ASCORBIC ACID Take 500 mg by mouth daily.   vitamin E 1000 UNIT capsule Take 1,000 Units by mouth daily.   zolpidem 10 MG tablet Commonly known as: AMBIEN TAKE 1 TABLET BY MOUTH EVERY DAY AT BEDTIME AS NEEDED FOR SLEEP            Durable Medical Equipment  (From admission, onward)         Start     Ordered   11/06/18 1009  DME Walker rolling  Once    Question:  Patient needs a  walker to treat with the following condition  Answer:  Status post total left knee replacement   11/06/18 1008   11/06/18 1009  DME 3 n 1  Once     11/06/18 1008           Discharge Care Instructions  (From admission, onward)         Start     Ordered   11/07/18 0000  Weight bearing as tolerated     11/07/18 0811          Diagnostic Studies: Dg Chest 2 View  Result Date: 10/31/2018 CLINICAL DATA:  Preop films for knee arthroplasty.  Former smoker. EXAM: CHEST - 2 VIEW COMPARISON:  None. FINDINGS: Heart and mediastinal contours are within normal limits. No focal opacities or effusions. No acute bony abnormality. IMPRESSION: No active cardiopulmonary disease. Electronically Signed   By: Rolm Baptise M.D.   On: 10/31/2018 16:25    Disposition: Discharge disposition: 01-Home or Self Care       Discharge Instructions    Call MD / Call 911   Complete by: As directed    If you experience chest pain or shortness of breath, CALL 911 and be transported to the hospital emergency room.  If you develope a fever above 101 F, pus (white drainage) or increased drainage or redness at the wound, or calf pain, call your surgeon's office.   Constipation Prevention   Complete by: As directed    Drink plenty of fluids.  Prune juice may be helpful.  You may use a stool softener, such as Colace (over the counter) 100 mg twice a day.  Use MiraLax (over the counter) for constipation as needed.   Diet - low sodium heart healthy   Complete by: As directed    Driving restrictions   Complete by: As directed    No driving for 2 weeks   Increase activity slowly as tolerated   Complete by: As directed    Patient may shower   Complete by: As directed    You may shower without a dressing once there is no drainage.  Do not wash over the wound.  If drainage remains, cover wound with plastic wrap and then shower.   Weight bearing as tolerated   Complete by: As directed       Follow-up Information     Frederik Pear, MD. Go on 11/21/2018.   Specialty: Orthopedic Surgery Why: Your appointment has been scheduled for 1015 Contact information: [place] [address]  [phone]        [facility] Follow up.   Specialty: [place] Why: You will be seen at home by [facility]le [facility] for 5 visits prior to starting outpatient physical therapy  Contact information: [address] [place] [address] [phone]        [facility], [place]. Go on 11/21/2018.   Why: You are scheduled to start outpatient physical therapy at Contact information: Physical Therapy [address] [place] [address] [phone]            Signed: Joanell Rising 11/07/2018, 8:11 AM

## 2018-11-07 NOTE — Plan of Care (Signed)
  Problem: Education: Goal: Knowledge of the prescribed therapeutic regimen will improve Outcome: Progressing   Problem: Activity: Goal: Ability to avoid complications of mobility impairment will improve Outcome: Progressing   Problem: Pain Management: Goal: Pain level will decrease with appropriate interventions Outcome: Progressing   Problem: Coping: Goal: Level of anxiety will decrease Outcome: Progressing

## 2018-11-07 NOTE — Progress Notes (Signed)
PATIENT ID: [name]  MRN: [id number]  DOB/AGE:  Sep 13, 1945 / 73 y.o.  1 Day Post-Op Procedure(s) (LRB): LEFT TOTAL KNEE ARTHROPLASTY (Left)    PROGRESS NOTE Subjective: Patient is alert, oriented, no Nausea, no Vomiting, yes passing gas. Taking PO well. Denies SOB, Chest or Calf Pain. Using Incentive Spirometer, PAS in place. Ambulate 80', Patient reports pain as 2/10 .    Objective: Vital signs in last 24 hours: Vitals:   11/06/18 1714 11/06/18 2138 11/07/18 0133 11/07/18 0528  BP: 125/71 102/66 135/71 107/62  Pulse: 68 75 71 85  Resp: 20 20 16 17   Temp: 98 F (36.7 C) 98.4 F (36.9 C) 98.2 F (36.8 C) 98.4 F (36.9 C)  TempSrc: Oral Oral    SpO2: 100% 99% 100% 100%  Weight:      Height:          Intake/Output from previous day: I/O last 3 completed shifts: In: 3867.3 [P.O.:840; I.V.:3027.3] Out: 3600 [Urine:3550; Blood:50]   Intake/Output this shift: No intake/output data recorded.   LABORATORY DATA: Recent Labs    11/07/18 0229  WBC 8.6  HGB 11.0*  HCT 34.1*  PLT 233  NA 134*  K 4.2  CL 103  CO2 25  BUN 8  CREATININE 0.65  GLUCOSE 184*  CALCIUM 8.4*    Examination: Neurologically intact ABD soft Neurovascular intact Sensation intact distally Intact pulses distally Dorsiflexion/Plantar flexion intact Incision: dressing C/D/I No cellulitis present Compartment soft}  Assessment:   1 Day Post-Op Procedure(s) (LRB): LEFT TOTAL KNEE ARTHROPLASTY (Left) ADDITIONAL DIAGNOSIS: Expected Acute Blood Loss Anemia,   Patient's anticipated LOS is less than 2 midnights, meeting these requirements: - Younger than 69 - Lives within 1 hour of care - Has a competent adult at home to recover with post-op recover - NO history of  - Chronic pain requiring opiods  - Diabetes  - Coronary Artery Disease  - Heart failure  - Heart attack  - Stroke  - DVT/VTE  - Cardiac arrhythmia  - Respiratory Failure/COPD  - Renal failure  - Anemia  - Advanced  Liver disease       Plan: PT/OT WBAT, AROM and PROM  DVT Prophylaxis:  SCDx72hrs, ASA 81 mg BID x 2 weeks DISCHARGE PLAN: Home, today DISCHARGE NEEDS: HHPT, Walker and 3-in-1 comode seat     Kerin Salen 11/07/2018, 8:02 AM Patient ID: [name], female   DOB: 05-Jun-1945, 73 y.o.   MRN: [id number]

## 2018-11-07 NOTE — Progress Notes (Signed)
Physical Therapy Treatment Patient Details Name: [name] MRN: [id number] DOB: 10/07/1945 Today's Date: 11/07/2018    History of Present Illness s/p L TKA    PT Comments    Pt progressing well. Ready for d/c from PT standpoint; pt dtr is a PT, will work on exercises/have secodn PT session later today.   Follow Up Recommendations  Follow surgeon's recommendation for DC plan and follow-up therapies     Equipment Recommendations  None recommended by PT    Recommendations for Other Services       Precautions / Restrictions Precautions Precautions: Knee Restrictions Weight Bearing Restrictions: No Other Position/Activity Restrictions: WBAT    Mobility  Bed Mobility Overal bed mobility: Needs Assistance Bed Mobility: Supine to Sit     Supine to sit: Supervision     General bed mobility comments: incr time, no physical assist   Transfers Overall transfer level: Needs assistance Equipment used: Rolling walker (2 wheeled) Transfers: Sit to/from Stand Sit to Stand: Min guard         General transfer comment: cues for hand placement and LLE position  Ambulation/Gait Ambulation/Gait assistance: Supervision Gait Distance (Feet): 90 [id number]' more) Assistive device: Rolling walker (2 wheeled) Gait Pattern/deviations: Step-to pattern;Decreased stance time - left     General Gait Details: cues for sequence, step length, for safety with turns   Stairs             Wheelchair Mobility    Modified Rankin (Stroke Patients Only)       Balance                                            Cognition Arousal/Alertness: Awake/alert Behavior During Therapy: WFL for tasks assessed/performed Overall Cognitive Status: Within Functional Limits for tasks assessed                                        Exercises Total Joint Exercises Ankle Circles/Pumps: AROM;Both;10 reps Quad Sets: 10 reps;Both;AROM Short Arc Quad:  AROM;10 reps Heel Slides: AROM;10 reps;Left;AAROM Hip ABduction/ADduction: AROM;10 reps;Left Straight Leg Raises: AROM;Left;10 reps;AAROM Goniometric ROM: grossly 5 to 70 degrees knee flexion AAROM left knee    General Comments        Pertinent Vitals/Pain Pain Assessment: 0-10 Pain Score: 6  Pain Location: L knee Pain Descriptors / Indicators: Grimacing;Guarding;Tightness Pain Intervention(s): Limited activity within patient's tolerance;Monitored during session;Premedicated before session;Repositioned;Ice applied    Home Living                      Prior Function            PT Goals (current goals can now be found in the care plan section) Acute Rehab PT Goals Time For Goal Achievement: 11/13/18 Potential to Achieve Goals: Good Progress towards PT goals: Progressing toward goals    Frequency    7X/week      PT Plan Current plan remains appropriate    Co-evaluation              AM-PAC PT "6 Clicks" Mobility   Outcome Measure  Help needed turning from your back to your side while in a flat bed without using bedrails?: A Little Help needed moving from lying on your back to sitting on the side  of a flat bed without using bedrails?: A Little Help needed moving to and from a bed to a chair (including a wheelchair)?: A Little Help needed standing up from a chair using your arms (e.g., wheelchair or bedside chair)?: A Little Help needed to walk in hospital room?: A Little Help needed climbing 3-5 steps with a railing? : A Little 6 Click Score: 18    End of Session Equipment Utilized During Treatment: Gait belt Activity Tolerance: Patient tolerated treatment well Patient left: in chair;with call bell/phone within reach;with chair alarm set Nurse Communication: Mobility status PT Visit Diagnosis: Difficulty in walking, not elsewhere classified (R26.2)     Time: [id number] PT Time Calculation (min) (ACUTE ONLY): 33 min  Charges:  $Gait Training:  8-22 mins $Therapeutic Exercise: 8-22 mins                     Kenyon Ana, PT  Pager: [phone] Acute Rehab Dept [facility]): [id number]   11/07/2018    [facility] 11/07/2018, 10:17 AM

## 2018-11-08 DIAGNOSIS — E039 Hypothyroidism, unspecified: Secondary | ICD-10-CM | POA: Diagnosis not present

## 2018-11-08 DIAGNOSIS — Z96652 Presence of left artificial knee joint: Secondary | ICD-10-CM | POA: Diagnosis not present

## 2018-11-08 DIAGNOSIS — G4700 Insomnia, unspecified: Secondary | ICD-10-CM | POA: Diagnosis not present

## 2018-11-08 DIAGNOSIS — Z471 Aftercare following joint replacement surgery: Secondary | ICD-10-CM | POA: Diagnosis not present

## 2018-11-08 DIAGNOSIS — M47819 Spondylosis without myelopathy or radiculopathy, site unspecified: Secondary | ICD-10-CM | POA: Diagnosis not present

## 2018-11-08 DIAGNOSIS — Z87891 Personal history of nicotine dependence: Secondary | ICD-10-CM | POA: Diagnosis not present

## 2018-11-08 DIAGNOSIS — M8580 Other specified disorders of bone density and structure, unspecified site: Secondary | ICD-10-CM | POA: Diagnosis not present

## 2018-11-08 DIAGNOSIS — F419 Anxiety disorder, unspecified: Secondary | ICD-10-CM | POA: Diagnosis not present

## 2018-11-08 DIAGNOSIS — M1711 Unilateral primary osteoarthritis, right knee: Secondary | ICD-10-CM | POA: Diagnosis not present

## 2018-11-21 DIAGNOSIS — Z471 Aftercare following joint replacement surgery: Secondary | ICD-10-CM | POA: Diagnosis not present

## 2018-11-21 DIAGNOSIS — M25562 Pain in left knee: Secondary | ICD-10-CM | POA: Diagnosis not present

## 2018-11-21 DIAGNOSIS — Z96652 Presence of left artificial knee joint: Secondary | ICD-10-CM | POA: Diagnosis not present

## 2018-11-23 DIAGNOSIS — Z96652 Presence of left artificial knee joint: Secondary | ICD-10-CM | POA: Diagnosis not present

## 2018-11-23 DIAGNOSIS — M25562 Pain in left knee: Secondary | ICD-10-CM | POA: Diagnosis not present

## 2018-11-23 DIAGNOSIS — Z471 Aftercare following joint replacement surgery: Secondary | ICD-10-CM | POA: Diagnosis not present

## 2018-11-28 DIAGNOSIS — Z96652 Presence of left artificial knee joint: Secondary | ICD-10-CM | POA: Diagnosis not present

## 2018-11-28 DIAGNOSIS — Z471 Aftercare following joint replacement surgery: Secondary | ICD-10-CM | POA: Diagnosis not present

## 2018-11-28 DIAGNOSIS — M25562 Pain in left knee: Secondary | ICD-10-CM | POA: Diagnosis not present

## 2018-11-30 DIAGNOSIS — M25562 Pain in left knee: Secondary | ICD-10-CM | POA: Diagnosis not present

## 2018-11-30 DIAGNOSIS — Z471 Aftercare following joint replacement surgery: Secondary | ICD-10-CM | POA: Diagnosis not present

## 2018-11-30 DIAGNOSIS — Z96652 Presence of left artificial knee joint: Secondary | ICD-10-CM | POA: Diagnosis not present

## 2018-12-04 DIAGNOSIS — M25562 Pain in left knee: Secondary | ICD-10-CM | POA: Diagnosis not present

## 2018-12-04 DIAGNOSIS — Z471 Aftercare following joint replacement surgery: Secondary | ICD-10-CM | POA: Diagnosis not present

## 2018-12-04 DIAGNOSIS — Z96652 Presence of left artificial knee joint: Secondary | ICD-10-CM | POA: Diagnosis not present

## 2018-12-06 DIAGNOSIS — Z96652 Presence of left artificial knee joint: Secondary | ICD-10-CM | POA: Diagnosis not present

## 2018-12-06 DIAGNOSIS — Z471 Aftercare following joint replacement surgery: Secondary | ICD-10-CM | POA: Diagnosis not present

## 2018-12-06 DIAGNOSIS — M25562 Pain in left knee: Secondary | ICD-10-CM | POA: Diagnosis not present

## 2018-12-12 DIAGNOSIS — Z96652 Presence of left artificial knee joint: Secondary | ICD-10-CM | POA: Diagnosis not present

## 2018-12-12 DIAGNOSIS — Z471 Aftercare following joint replacement surgery: Secondary | ICD-10-CM | POA: Diagnosis not present

## 2018-12-12 DIAGNOSIS — M25562 Pain in left knee: Secondary | ICD-10-CM | POA: Diagnosis not present

## 2018-12-20 DIAGNOSIS — H401431 Capsular glaucoma with pseudoexfoliation of lens, bilateral, mild stage: Secondary | ICD-10-CM | POA: Diagnosis not present

## 2018-12-20 DIAGNOSIS — H25813 Combined forms of age-related cataract, bilateral: Secondary | ICD-10-CM | POA: Diagnosis not present

## 2018-12-20 DIAGNOSIS — H40013 Open angle with borderline findings, low risk, bilateral: Secondary | ICD-10-CM | POA: Diagnosis not present

## 2019-01-16 DIAGNOSIS — Z1231 Encounter for screening mammogram for malignant neoplasm of breast: Secondary | ICD-10-CM | POA: Diagnosis not present

## 2019-01-16 LAB — HM MAMMOGRAPHY

## 2019-01-17 DIAGNOSIS — M25562 Pain in left knee: Secondary | ICD-10-CM | POA: Diagnosis not present

## 2019-01-17 DIAGNOSIS — Z96652 Presence of left artificial knee joint: Secondary | ICD-10-CM | POA: Diagnosis not present

## 2019-01-17 DIAGNOSIS — Z471 Aftercare following joint replacement surgery: Secondary | ICD-10-CM | POA: Diagnosis not present

## 2019-01-18 ENCOUNTER — Encounter: Payer: Self-pay | Admitting: Internal Medicine

## 2019-02-05 ENCOUNTER — Ambulatory Visit: Payer: No Typology Code available for payment source | Attending: Internal Medicine

## 2019-02-05 DIAGNOSIS — Z23 Encounter for immunization: Secondary | ICD-10-CM | POA: Insufficient documentation

## 2019-02-05 NOTE — Progress Notes (Signed)
   Covid-19 Vaccination Clinic  Name:  [name]    MRN: [id number] DOB: [birth year]  02/05/2019  [name] was observed post Covid-19 immunization for 15 minutes without incidence. She was provided with Vaccine Information Sheet and instruction to access the V-Safe system.   [name] was instructed to call 911 with any severe reactions post vaccine: Marland Kitchen Difficulty breathing  . Swelling of your face and throat  . A fast heartbeat  . A bad rash all over your body  . Dizziness and weakness    Immunizations Administered    Name Date Dose VIS Date Route   Pfizer COVID-19 Vaccine 02/05/2019 11:10 AM 0.3 mL 12/22/2018 Intramuscular   Manufacturer: [place]   Lot: [id number]   [place]: [id number]

## 2019-02-15 DIAGNOSIS — L814 Other melanin hyperpigmentation: Secondary | ICD-10-CM | POA: Diagnosis not present

## 2019-02-15 DIAGNOSIS — Z85828 Personal history of other malignant neoplasm of skin: Secondary | ICD-10-CM | POA: Diagnosis not present

## 2019-02-15 DIAGNOSIS — L821 Other seborrheic keratosis: Secondary | ICD-10-CM | POA: Diagnosis not present

## 2019-02-15 DIAGNOSIS — D229 Melanocytic nevi, unspecified: Secondary | ICD-10-CM | POA: Diagnosis not present

## 2019-02-15 DIAGNOSIS — L905 Scar conditions and fibrosis of skin: Secondary | ICD-10-CM | POA: Diagnosis not present

## 2019-02-15 DIAGNOSIS — L819 Disorder of pigmentation, unspecified: Secondary | ICD-10-CM | POA: Diagnosis not present

## 2019-02-15 DIAGNOSIS — D1801 Hemangioma of skin and subcutaneous tissue: Secondary | ICD-10-CM | POA: Diagnosis not present

## 2019-02-15 DIAGNOSIS — L570 Actinic keratosis: Secondary | ICD-10-CM | POA: Diagnosis not present

## 2019-02-22 ENCOUNTER — Ambulatory Visit: Payer: Medicare Other

## 2019-02-22 ENCOUNTER — Other Ambulatory Visit: Payer: Self-pay | Admitting: Internal Medicine

## 2019-02-26 ENCOUNTER — Ambulatory Visit: Payer: No Typology Code available for payment source | Attending: Internal Medicine

## 2019-02-26 DIAGNOSIS — Z23 Encounter for immunization: Secondary | ICD-10-CM

## 2019-02-26 NOTE — Progress Notes (Signed)
   Covid-19 Vaccination Clinic  Name:  [name]    MRN: [id number] DOB: 27-Jul-1945  02/26/2019  [name] was observed post Covid-19 immunization for 15 minutes without incidence. She was provided with Vaccine Information Sheet and instruction to access the V-Safe system.   [name] was instructed to call 911 with any severe reactions post vaccine: Marland Kitchen Difficulty breathing  . Swelling of your face and throat  . A fast heartbeat  . A bad rash all over your body  . Dizziness and weakness    Immunizations Administered    Name Date Dose VIS Date Route   Pfizer COVID-19 Vaccine 02/26/2019 11:04 AM 0.3 mL 12/22/2018 Intramuscular   Manufacturer: [place]   Lot: [id number]   [place]: [id number]

## 2019-03-22 DIAGNOSIS — H2589 Other age-related cataract: Secondary | ICD-10-CM | POA: Diagnosis not present

## 2019-03-22 DIAGNOSIS — H25813 Combined forms of age-related cataract, bilateral: Secondary | ICD-10-CM | POA: Diagnosis not present

## 2019-03-22 DIAGNOSIS — H40023 Open angle with borderline findings, high risk, bilateral: Secondary | ICD-10-CM | POA: Diagnosis not present

## 2019-03-22 DIAGNOSIS — H04123 Dry eye syndrome of bilateral lacrimal glands: Secondary | ICD-10-CM | POA: Diagnosis not present

## 2019-04-20 ENCOUNTER — Other Ambulatory Visit: Payer: Self-pay | Admitting: Internal Medicine

## 2019-04-20 DIAGNOSIS — G4700 Insomnia, unspecified: Secondary | ICD-10-CM

## 2019-04-24 DIAGNOSIS — Z96652 Presence of left artificial knee joint: Secondary | ICD-10-CM | POA: Diagnosis not present

## 2019-05-25 DIAGNOSIS — H25811 Combined forms of age-related cataract, right eye: Secondary | ICD-10-CM | POA: Diagnosis not present

## 2019-06-28 ENCOUNTER — Other Ambulatory Visit: Payer: Self-pay | Admitting: Internal Medicine

## 2019-06-28 ENCOUNTER — Other Ambulatory Visit: Payer: Self-pay | Admitting: *Deleted

## 2019-06-28 DIAGNOSIS — G4700 Insomnia, unspecified: Secondary | ICD-10-CM

## 2019-06-28 MED ORDER — ZOLPIDEM TARTRATE 10 MG PO TABS: ORAL_TABLET | ORAL | 0 refills | Status: DC

## 2019-06-29 NOTE — Telephone Encounter (Signed)
Can you please deny this refill?  This is a duplicate.  thanks

## 2019-08-16 ENCOUNTER — Other Ambulatory Visit: Payer: Self-pay | Admitting: Internal Medicine

## 2019-08-22 ENCOUNTER — Ambulatory Visit (INDEPENDENT_AMBULATORY_CARE_PROVIDER_SITE_OTHER): Payer: Medicare Other | Admitting: Internal Medicine

## 2019-08-22 ENCOUNTER — Other Ambulatory Visit: Payer: Self-pay

## 2019-08-22 ENCOUNTER — Encounter: Payer: Self-pay | Admitting: Internal Medicine

## 2019-08-22 VITALS — BP 120/78 | HR 71 | Temp 98.1°F | Ht 61.5 in | Wt 153.8 lb

## 2019-08-22 DIAGNOSIS — Z0000 Encounter for general adult medical examination without abnormal findings: Secondary | ICD-10-CM | POA: Diagnosis not present

## 2019-08-22 DIAGNOSIS — Z0001 Encounter for general adult medical examination with abnormal findings: Secondary | ICD-10-CM

## 2019-08-22 DIAGNOSIS — E039 Hypothyroidism, unspecified: Secondary | ICD-10-CM | POA: Diagnosis not present

## 2019-08-22 DIAGNOSIS — G4700 Insomnia, unspecified: Secondary | ICD-10-CM | POA: Diagnosis not present

## 2019-08-22 DIAGNOSIS — Z96652 Presence of left artificial knee joint: Secondary | ICD-10-CM

## 2019-08-22 DIAGNOSIS — J209 Acute bronchitis, unspecified: Secondary | ICD-10-CM | POA: Diagnosis not present

## 2019-08-22 MED ORDER — AZITHROMYCIN 250 MG PO TABS: ORAL_TABLET | ORAL | 0 refills | Status: DC

## 2019-08-22 MED ORDER — ZOLPIDEM TARTRATE 10 MG PO TABS: ORAL_TABLET | ORAL | 5 refills | Status: DC

## 2019-08-22 MED ORDER — LORAZEPAM 0.5 MG PO TABS: 0.5000 mg | ORAL_TABLET | Freq: Every day | ORAL | 1 refills | Status: DC | PRN

## 2019-08-22 NOTE — Patient Instructions (Signed)
-Nice seeing you today!!  -Lab work today; will notify you once results are available.  -Start a zpak as directed for 5 days. Consider an antihistamine daily (claritin, zyrtec, allegra).  -Consider your shingles vaccine at your pharmacy.  -Let us know if you decide to proceed with the audiology exam.  -Schedule follow up in 1 year or sooner if your cough does not improve after the zpak.   Preventive Care 73 Years and Older, Female Preventive care refers to lifestyle choices and visits with your health care provider that can promote health and wellness. This includes:  A yearly physical exam. This is also called an annual well check.  Regular dental and eye exams.  Immunizations.  Screening for certain conditions.  Healthy lifestyle choices, such as diet and exercise. What can I expect for my preventive care visit? Physical exam Your health care provider will check:  Height and weight. These may be used to calculate body mass index (BMI), which is a measurement that tells if you are at a healthy weight.  Heart rate and blood pressure.  Your skin for abnormal spots. Counseling Your health care provider may ask you questions about:  Alcohol, tobacco, and drug use.  Emotional well-being.  Home and relationship well-being.  Sexual activity.  Eating habits.  History of falls.  Memory and ability to understand (cognition).  Work and work Statistician.  Pregnancy and menstrual history. What immunizations do I need?  Influenza (flu) vaccine  This is recommended every year. Tetanus, diphtheria, and pertussis (Tdap) vaccine  You may need a Td booster every 10 years. Varicella (chickenpox) vaccine  You may need this vaccine if you have not already been vaccinated. Zoster (shingles) vaccine  You may need this after age 73. Pneumococcal conjugate (PCV13) vaccine  One dose is recommended after age 73. Pneumococcal polysaccharide (PPSV23) vaccine  One dose is  recommended after age 73. Measles, mumps, and rubella (MMR) vaccine  You may need at least one dose of MMR if you were born in 1957 or later. You may also need a second dose. Meningococcal conjugate (MenACWY) vaccine  You may need this if you have certain conditions. Hepatitis A vaccine  You may need this if you have certain conditions or if you travel or work in places where you may be exposed to hepatitis A. Hepatitis B vaccine  You may need this if you have certain conditions or if you travel or work in places where you may be exposed to hepatitis B. Haemophilus influenzae type b (Hib) vaccine  You may need this if you have certain conditions. You may receive vaccines as individual doses or as more than one vaccine together in one shot (combination vaccines). Talk with your health care provider about the risks and benefits of combination vaccines. What tests do I need? Blood tests  Lipid and cholesterol levels. These may be checked every 5 years, or more frequently depending on your overall health.  Hepatitis C test.  Hepatitis B test. Screening  Lung cancer screening. You may have this screening every year starting at age 73 if you have a 30-pack-year history of smoking and currently smoke or have quit within the past 15 years.  Colorectal cancer screening. All adults should have this screening starting at age 73 and continuing until age 73. Your health care provider may recommend screening at age 73 if if you are at increased risk. You will have tests every 1-10 years, depending on your results and the type of screening test.  Diabetes screening. This is done by checking your blood sugar (glucose) after you have not eaten for a while (fasting). You may have this done every 1-3 years.  Mammogram. This may be done every 73 years. Talk with your health care provider about how often you should have regular mammograms.  BRCA-related cancer screening. This may be done if you have a  family history of breast, ovarian, tubal, or peritoneal cancers. Other tests  Sexually transmitted disease (STD) testing.  Bone density scan. This is done to screen for osteoporosis. You may have this done starting at age 73. Follow these instructions at home: Eating and drinking  Eat a diet that includes fresh fruits and vegetables, whole grains, lean protein, and low-fat dairy products. Limit your intake of foods with high amounts of sugar, saturated fats, and salt.  Take vitamin and mineral supplements as recommended by your health care provider.  Do not drink alcohol if your health care provider tells you not to drink.  If you drink alcohol: ? Limit how much you have to 0-1 drink a day. ? Be aware of how much alcohol is in your drink. In the [place] one drink equals one 12 oz bottle of beer (355 mL), one 5 oz glass of wine (148 mL), or one 1 oz glass of hard liquor (44 mL). Lifestyle  Take daily care of your teeth and gums.  Stay active. Exercise for at least 30 minutes on 5 or more days each week.  Do not use any products that contain nicotine or tobacco, such as cigarettes, e-cigarettes, and chewing tobacco. If you need help quitting, ask your health care provider.  If you are sexually active, practice safe sex. Use a condom or other form of protection in order to prevent STIs (sexually transmitted infections).  Talk with your health care provider about taking a low-dose aspirin or statin. What's next?  Go to your health care provider once a year for a well check visit.  Ask your health care provider how often you should have your eyes and teeth checked.  Stay up to date on all vaccines. This information is not intended to replace advice given to you by your health care provider. Make sure you discuss any questions you have with your health care provider. Document Revised: 12/22/2017 Document Reviewed: 12/22/2017 Elsevier Patient Education  2020 [employer]  

## 2019-08-22 NOTE — Progress Notes (Signed)
Established Patient Office Visit     This visit occurred during the SARS-CoV-2 public health emergency.  Safety protocols were in place, including screening questions prior to the visit, additional usage of staff PPE, and extensive cleaning of exam room while observing appropriate contact time as indicated for disinfecting solutions.    CC/Reason for Visit: Subsequent Medicare wellness visit, discuss an acute concern  HPI: [name] is a 74 y.o. female who is coming in today for the above mentioned reasons. Past Medical History is significant for:  Insomnia for which she takes Ambien/Ativan very infrequently, refills today, she has a history of hypothyroidism for which she takes 25 mcg of Synthroid daily.  She has left knee osteoarthritis and had knee replacement surgery this past year.  She also had eye cataract surgery.  She has been doing well on both accounts.  She has received her Covid vaccinations.  She has routine eye and dental care.  She is very active, does stretches, weights and walks on a daily basis.  She has been going to the pool frequently.  She has noticed some issues with her hearing.  She is due for shingles vaccine, she had a colonoscopy in 2014, her last mammogram was in January 2021.  Ever since returning from a beach trip about 2 weeks ago she has had some sinus drainage, cough, no fevers, no sick contacts.   Past Medical/Surgical History: Past Medical History:  Diagnosis Date  . Anxiety   . Arthritis    Knees,back,  . Hypothyroidism   . Osteopenia     Past Surgical History:  Procedure Laterality Date  . COLONOSCOPY  2014  . TOTAL KNEE ARTHROPLASTY Left 11/06/2018   Procedure: LEFT TOTAL KNEE ARTHROPLASTY;  Surgeon: Frederik Pear, MD;  Location: [facility];  Service: Orthopedics;  Laterality: Left;    Social History:  reports that she quit smoking about 8 years ago. Her smoking use included cigarettes. She has a 15.00 pack-year smoking history. She  has never used smokeless tobacco. She reports current alcohol use. She reports that she does not use drugs.  Allergies: No Known Allergies  Family History:  Family History  Problem Relation Age of Onset  . Heart disease Mother   . Arthritis Mother   . Hypertension Father   . Arthritis Father      Current Outpatient Medications:  .  levothyroxine (SYNTHROID) 25 MCG tablet, TAKE 1 TABLET (25 MCG TOTAL) BY MOUTH DAILY BEFORE BREAKFAST., Disp: 90 tablet, Rfl: 1 .  LORazepam (ATIVAN) 0.5 MG tablet, Take 1 tablet (0.5 mg total) by mouth daily as needed for anxiety., Disp: 90 tablet, Rfl: 1 .  Multiple Vitamin (MULTIVITAMIN) tablet, Take 1 tablet by mouth daily., Disp: , Rfl:  .  Multiple Vitamins-Minerals (CENTRUM SILVER 50+WOMEN PO), Take by mouth., Disp: , Rfl:  .  naproxen sodium (ALEVE) 220 MG tablet, Take 220 mg by mouth 2 (two) times daily as needed., Disp: , Rfl:  .  Omega-3 Fatty Acids (FISH OIL) 1200 MG CAPS, Take 1 capsule by mouth daily., Disp: , Rfl:  .  Polyethyl Glycol-Propyl Glycol (SYSTANE) 0.4-0.3 % SOLN, Apply 1 drop to eye 3 (three) times daily., Disp: , Rfl:  .  vitamin C (ASCORBIC ACID) 500 MG tablet, Take 500 mg by mouth daily., Disp: , Rfl:  .  vitamin E 1000 UNIT capsule, Take 1,000 Units by mouth daily. , Disp: , Rfl:  .  zolpidem (AMBIEN) 10 MG tablet, TAKE 1 TABLET BY  MOUTH AT BEDTIME AS NEEDED FOR SLEEP, Disp: 30 tablet, Rfl: 5 .  azithromycin (ZITHROMAX) 250 MG tablet, Take as directed, Disp: 6 tablet, Rfl: 0  Review of Systems:  Constitutional: Denies fever, chills, diaphoresis, appetite change and fatigue.  HEENT: Denies photophobia, eye pain, redness, hearing loss, ear pain,  mouth sores, trouble swallowing, neck pain, neck stiffness and tinnitus.   Respiratory: Denies cough, chest tightness,  and wheezing.   Cardiovascular: Denies chest pain, palpitations and leg swelling.  Gastrointestinal: Denies nausea, vomiting, abdominal pain, diarrhea, constipation,  blood in stool and abdominal distention.  Genitourinary: Denies dysuria, urgency, frequency, hematuria, flank pain and difficulty urinating.  Endocrine: Denies: hot or cold intolerance, sweats, changes in hair or nails, polyuria, polydipsia. Musculoskeletal: Denies myalgias, back pain, joint swelling, arthralgias and gait problem.  Skin: Denies pallor, rash and wound.  Neurological: Denies dizziness, seizures, syncope, weakness, light-headedness, numbness and headaches.  Hematological: Denies adenopathy. Easy bruising, personal or family bleeding history  Psychiatric/Behavioral: Denies suicidal ideation, mood changes, confusion, nervousness, sleep disturbance and agitation    Physical Exam: Vitals:   08/22/19 1033  BP: 120/78  Pulse: 71  Temp: 98.1 F (36.7 C)  TempSrc: Oral  SpO2: 97%  Weight: 153 lb 12.8 oz (69.8 kg)  Height: 5' 1.5" (1.562 m)    Body mass index is 28.59 kg/m.   Constitutional: NAD, calm, comfortable Eyes: PERRL, lids and conjunctivae normal ENMT: Mucous membranes are moist. Posterior pharynx clear of any exudate or lesions.  Positive pharyngeal erythema.   Tympanic membrane is pearly white, no erythema or bulging. Neck: normal, supple, no masses, no thyromegaly Respiratory: Mild right basilar crackles, no wheezes.  Normal respiratory effort. No accessory muscle use.  Cardiovascular: Regular rate and rhythm, no murmurs / rubs / gallops. No extremity edema. 2+ pedal pulses.   Abdomen: no tenderness, no masses palpated. No hepatosplenomegaly. Bowel sounds positive.  Musculoskeletal: no clubbing / cyanosis. No joint deformity upper and lower extremities. Good ROM, no contractures. Normal muscle tone.  Skin: no rashes, lesions, ulcers. No induration Neurologic: CN 2-12 grossly intact. Sensation intact, DTR normal. Strength 5/5 in all 4.  Psychiatric: Normal judgment and insight. Alert and oriented x 3. Normal mood.    Impression and Plan:  Encounter for  subsequent annual wellness visit in Medicare patient  -She has routine eye and dental care. -She is overdue for shingles vaccination series which she will obtain at her pharmacy, otherwise immunizations are up-to-date including Covid x2. -Screening labs today. -Healthy lifestyle discussed in detail. -She had a colonoscopy in 2014 and is a 10-year callback. -She had a mammogram in January 2021. -Due for DEXA scan next year.  Insomnia, unspecified type  - Plan: LORazepam (ATIVAN) 0.5 MG tablet, zolpidem (AMBIEN) 10 MG tablet  Acquired hypothyroidism  -Check TSH.  S/P total knee arthroplasty, left -Noted, followed by Dr. Mayer Camel  Acute bronchitis, unspecified organism  -Advised Z-Pak, antihistamines, return in 10 to 14 days if no improvement.   Patient Instructions  -Nice seeing you today!!  -Lab work today; will notify you once results are available.  -Start a zpak as directed for 5 days. Consider an antihistamine daily (claritin, zyrtec, allegra).  -Consider your shingles vaccine at your pharmacy.  -Let us know if you decide to proceed with the audiology exam.  -Schedule follow up in 1 year or sooner if your cough does not improve after the zpak.   Preventive Care 95 Years and Older, Female Preventive care refers to lifestyle choices and visits  with your health care provider that can promote health and wellness. This includes:  A yearly physical exam. This is also called an annual well check.  Regular dental and eye exams.  Immunizations.  Screening for certain conditions.  Healthy lifestyle choices, such as diet and exercise. What can I expect for my preventive care visit? Physical exam Your health care provider will check:  Height and weight. These may be used to calculate body mass index (BMI), which is a measurement that tells if you are at a healthy weight.  Heart rate and blood pressure.  Your skin for abnormal spots. Counseling Your health care provider  may ask you questions about:  Alcohol, tobacco, and drug use.  Emotional well-being.  Home and relationship well-being.  Sexual activity.  Eating habits.  History of falls.  Memory and ability to understand (cognition).  Work and work Statistician.  Pregnancy and menstrual history. What immunizations do I need?  Influenza (flu) vaccine  This is recommended every year. Tetanus, diphtheria, and pertussis (Tdap) vaccine  You may need a Td booster every 10 years. Varicella (chickenpox) vaccine  You may need this vaccine if you have not already been vaccinated. Zoster (shingles) vaccine  You may need this after age 73. Pneumococcal conjugate (PCV13) vaccine  One dose is recommended after age 24. Pneumococcal polysaccharide (PPSV23) vaccine  One dose is recommended after age 6. Measles, mumps, and rubella (MMR) vaccine  You may need at least one dose of MMR if you were born in 1957 or later. You may also need a second dose. Meningococcal conjugate (MenACWY) vaccine  You may need this if you have certain conditions. Hepatitis A vaccine  You may need this if you have certain conditions or if you travel or work in places where you may be exposed to hepatitis A. Hepatitis B vaccine  You may need this if you have certain conditions or if you travel or work in places where you may be exposed to hepatitis B. Haemophilus influenzae type b (Hib) vaccine  You may need this if you have certain conditions. You may receive vaccines as individual doses or as more than one vaccine together in one shot (combination vaccines). Talk with your health care provider about the risks and benefits of combination vaccines. What tests do I need? Blood tests  Lipid and cholesterol levels. These may be checked every 5 years, or more frequently depending on your overall health.  Hepatitis C test.  Hepatitis B test. Screening  Lung cancer screening. You may have this screening every year  starting at age 63 if you have a 30-pack-year history of smoking and currently smoke or have quit within the past 15 years.  Colorectal cancer screening. All adults should have this screening starting at age 63 and continuing until age 73. Your health care provider may recommend screening at age 5 if you are at increased risk. You will have tests every 1-10 years, depending on your results and the type of screening test.  Diabetes screening. This is done by checking your blood sugar (glucose) after you have not eaten for a while (fasting). You may have this done every 1-3 years.  Mammogram. This may be done every 1-2 years. Talk with your health care provider about how often you should have regular mammograms.  BRCA-related cancer screening. This may be done if you have a family history of breast, ovarian, tubal, or peritoneal cancers. Other tests  Sexually transmitted disease (STD) testing.  Bone density scan. This is  done to screen for osteoporosis. You may have this done starting at age 64. Follow these instructions at home: Eating and drinking  Eat a diet that includes fresh fruits and vegetables, whole grains, lean protein, and low-fat dairy products. Limit your intake of foods with high amounts of sugar, saturated fats, and salt.  Take vitamin and mineral supplements as recommended by your health care provider.  Do not drink alcohol if your health care provider tells you not to drink.  If you drink alcohol: ? Limit how much you have to 0-1 drink a day. ? Be aware of how much alcohol is in your drink. In the [place] one drink equals one 12 oz bottle of beer (355 mL), one 5 oz glass of wine (148 mL), or one 1 oz glass of hard liquor (44 mL). Lifestyle  Take daily care of your teeth and gums.  Stay active. Exercise for at least 30 minutes on 5 or more days each week.  Do not use any products that contain nicotine or tobacco, such as cigarettes, e-cigarettes, and chewing tobacco. If  you need help quitting, ask your health care provider.  If you are sexually active, practice safe sex. Use a condom or other form of protection in order to prevent STIs (sexually transmitted infections).  Talk with your health care provider about taking a low-dose aspirin or statin. What's next?  Go to your health care provider once a year for a well check visit.  Ask your health care provider how often you should have your eyes and teeth checked.  Stay up to date on all vaccines. This information is not intended to replace advice given to you by your health care provider. Make sure you discuss any questions you have with your health care provider. Document Revised: 12/22/2017 Document Reviewed: 12/22/2017 Elsevier Patient Education  2020 [place], MD [facility]

## 2019-08-23 ENCOUNTER — Other Ambulatory Visit: Payer: Self-pay | Admitting: Internal Medicine

## 2019-08-23 ENCOUNTER — Encounter: Payer: Self-pay | Admitting: Internal Medicine

## 2019-08-23 DIAGNOSIS — E785 Hyperlipidemia, unspecified: Secondary | ICD-10-CM

## 2019-08-23 LAB — LIPID PANEL
Cholesterol: 217 mg/dL — ABNORMAL HIGH (ref <200)
HDL: 67 mg/dL (ref >=50)
LDL Cholesterol (Calc): 125 mg/dL (calc) — ABNORMAL HIGH
Non-HDL Cholesterol (Calc): 150 mg/dL (calc) — ABNORMAL HIGH (ref <130)
Total CHOL/HDL Ratio: 3.2 (calc) (ref <5.0)
Triglycerides: 137 mg/dL (ref <150)

## 2019-08-23 LAB — CBC WITH DIFFERENTIAL/PLATELET
Absolute Monocytes: 421 cells/uL (ref 200–950)
Basophils Absolute: 59 cells/uL (ref 0–200)
Basophils Relative: 1.2 %
Eosinophils Absolute: 103 cells/uL (ref 15–500)
Eosinophils Relative: 2.1 %
HCT: 41.4 % (ref 35.0–45.0)
Hemoglobin: 14.2 g/dL (ref 11.7–15.5)
Lymphs Abs: 1833 cells/uL (ref 850–3900)
MCH: 33.7 pg — ABNORMAL HIGH (ref 27.0–33.0)
MCHC: 34.3 g/dL (ref 32.0–36.0)
MCV: 98.3 fL (ref 80.0–100.0)
MPV: 9.3 fL (ref 7.5–12.5)
Monocytes Relative: 8.6 %
Neutro Abs: 2484 cells/uL (ref 1500–7800)
Neutrophils Relative %: 50.7 %
Platelets: 302 10*3/uL (ref 140–400)
RBC: 4.21 10*6/uL (ref 3.80–5.10)
RDW: 12 % (ref 11.0–15.0)
Total Lymphocyte: 37.4 %
WBC: 4.9 10*3/uL (ref 3.8–10.8)

## 2019-08-23 LAB — COMPREHENSIVE METABOLIC PANEL
AG Ratio: 1.8 (calc) (ref 1.0–2.5)
ALT: 12 U/L (ref 6–29)
AST: 17 U/L (ref 10–35)
Albumin: 4.4 g/dL (ref 3.6–5.1)
Alkaline phosphatase (APISO): 77 U/L (ref 37–153)
BUN: 10 mg/dL (ref 7–25)
CO2: 28 mmol/L (ref 20–32)
Calcium: 9.5 mg/dL (ref 8.6–10.4)
Chloride: 103 mmol/L (ref 98–110)
Creat: 0.7 mg/dL (ref 0.60–0.93)
Globulin: 2.4 g/dL (calc) (ref 1.9–3.7)
Glucose, Bld: 92 mg/dL (ref 65–99)
Potassium: 4.5 mmol/L (ref 3.5–5.3)
Sodium: 138 mmol/L (ref 135–146)
Total Bilirubin: 0.6 mg/dL (ref 0.2–1.2)
Total Protein: 6.8 g/dL (ref 6.1–8.1)

## 2019-08-23 LAB — TSH: TSH: 3.58 mIU/L (ref 0.40–4.50)

## 2019-09-10 ENCOUNTER — Other Ambulatory Visit: Payer: Self-pay | Admitting: Internal Medicine

## 2019-09-26 DIAGNOSIS — H04123 Dry eye syndrome of bilateral lacrimal glands: Secondary | ICD-10-CM | POA: Diagnosis not present

## 2019-09-26 DIAGNOSIS — H0102A Squamous blepharitis right eye, upper and lower eyelids: Secondary | ICD-10-CM | POA: Diagnosis not present

## 2019-09-26 DIAGNOSIS — H0102B Squamous blepharitis left eye, upper and lower eyelids: Secondary | ICD-10-CM | POA: Diagnosis not present

## 2019-09-26 DIAGNOSIS — H2589 Other age-related cataract: Secondary | ICD-10-CM | POA: Diagnosis not present

## 2019-09-26 DIAGNOSIS — H1045 Other chronic allergic conjunctivitis: Secondary | ICD-10-CM | POA: Diagnosis not present

## 2019-09-26 DIAGNOSIS — H40023 Open angle with borderline findings, high risk, bilateral: Secondary | ICD-10-CM | POA: Diagnosis not present

## 2019-09-26 DIAGNOSIS — Z961 Presence of intraocular lens: Secondary | ICD-10-CM | POA: Diagnosis not present

## 2019-09-26 DIAGNOSIS — H25812 Combined forms of age-related cataract, left eye: Secondary | ICD-10-CM | POA: Diagnosis not present

## 2019-10-02 ENCOUNTER — Other Ambulatory Visit: Payer: No Typology Code available for payment source

## 2019-10-02 ENCOUNTER — Telehealth (INDEPENDENT_AMBULATORY_CARE_PROVIDER_SITE_OTHER): Payer: Medicare Other | Admitting: Internal Medicine

## 2019-10-02 DIAGNOSIS — J069 Acute upper respiratory infection, unspecified: Secondary | ICD-10-CM

## 2019-10-02 DIAGNOSIS — Z20822 Contact with and (suspected) exposure to covid-19: Secondary | ICD-10-CM

## 2019-10-02 NOTE — Progress Notes (Signed)
Virtual Visit via Video Note  I connected with [name] on 10/02/19 at  8:30 AM EDT by a video enabled telemedicine application and verified that I am speaking with the correct person using two identifiers.  Location patient: home Location provider: work office Persons participating in the virtual visit: patient, provider  I discussed the limitations of evaluation and management by telemedicine and the availability of in person appointments. The patient expressed understanding and agreed to proceed.   HPI: She has been having sinus congestion and postnasal drip for the past week.  She has not taken her temperature but has felt warm at times.  She is also been having an intermittent headache and runny nose.  She has tried over-the-counter saline nasal spray, guaifenesin, Sudafed without significant relief.  She is fully vaccinated against Covid.  She has not had any sick contacts that she is aware of, no recent travel although she did play in a golf tournament 2 weekends ago outside without a mask and in close contact with others.   ROS: Constitutional: Denies fever, chills, diaphoresis, appetite change and fatigue.  HEENT: Denies photophobia, eye pain, redness, hearing loss, ear pain,  mouth sores, trouble swallowing, neck pain, neck stiffness and tinnitus.   Respiratory: Denies SOB, DOE, cough, chest tightness,  and wheezing.   Cardiovascular: Denies chest pain, palpitations and leg swelling.  Gastrointestinal: Denies nausea, vomiting, abdominal pain, diarrhea, constipation, blood in stool and abdominal distention.  Genitourinary: Denies dysuria, urgency, frequency, hematuria, flank pain and difficulty urinating.  Endocrine: Denies: hot or cold intolerance, sweats, changes in hair or nails, polyuria, polydipsia. Musculoskeletal: Denies myalgias, back pain, joint swelling, arthralgias and gait problem.  Skin: Denies pallor, rash and wound.  Neurological: Denies dizziness,  seizures, syncope, weakness, light-headedness, numbness and headaches.  Hematological: Denies adenopathy. Easy bruising, personal or family bleeding history  Psychiatric/Behavioral: Denies suicidal ideation, mood changes, confusion, nervousness, sleep disturbance and agitation   Past Medical History:  Diagnosis Date  . Anxiety   . Arthritis    Knees,back,  . Hypothyroidism   . Osteopenia     Past Surgical History:  Procedure Laterality Date  . COLONOSCOPY  2014  . TOTAL KNEE ARTHROPLASTY Left 11/06/2018   Procedure: LEFT TOTAL KNEE ARTHROPLASTY;  Surgeon: Frederik Pear, MD;  Location: [facility];  Service: Orthopedics;  Laterality: Left;    Family History  Problem Relation Age of Onset  . Heart disease Mother   . Arthritis Mother   . Hypertension Father   . Arthritis Father     SOCIAL HX:   reports that she quit smoking about 8 years ago. Her smoking use included cigarettes. She has a 15.00 pack-year smoking history. She has never used smokeless tobacco. She reports current alcohol use. She reports that she does not use drugs.   Current Outpatient Medications:  .  levothyroxine (SYNTHROID) 25 MCG tablet, TAKE 1 TABLET (25 MCG TOTAL) BY MOUTH DAILY BEFORE BREAKFAST., Disp: 90 tablet, Rfl: 1 .  LORazepam (ATIVAN) 0.5 MG tablet, Take 1 tablet (0.5 mg total) by mouth daily as needed for anxiety., Disp: 90 tablet, Rfl: 1 .  Multiple Vitamin (MULTIVITAMIN) tablet, Take 1 tablet by mouth daily., Disp: , Rfl:  .  Multiple Vitamins-Minerals (CENTRUM SILVER 50+WOMEN PO), Take by mouth., Disp: , Rfl:  .  naproxen sodium (ALEVE) 220 MG tablet, Take 220 mg by mouth 2 (two) times daily as needed., Disp: , Rfl:  .  Omega-3 Fatty Acids (FISH OIL) 1200 MG CAPS,  Take 1 capsule by mouth daily., Disp: , Rfl:  .  Polyethyl Glycol-Propyl Glycol (SYSTANE) 0.4-0.3 % SOLN, Apply 1 drop to eye 3 (three) times daily., Disp: , Rfl:  .  vitamin C (ASCORBIC ACID) 500 MG tablet, Take 500 mg by mouth daily.,  Disp: , Rfl:  .  vitamin E 1000 UNIT capsule, Take 1,000 Units by mouth daily. , Disp: , Rfl:  .  zolpidem (AMBIEN) 10 MG tablet, TAKE 1 TABLET BY MOUTH AT BEDTIME AS NEEDED FOR SLEEP, Disp: 30 tablet, Rfl: 5  EXAM:   VITALS per patient if applicable: None reported  GENERAL: alert, oriented, appears well and in no acute distress  HEENT: atraumatic, conjunttiva clear, no obvious abnormalities on inspection of external nose and ears, wears corrective lenses  NECK: normal movements of the head and neck  LUNGS: on inspection no signs of respiratory distress, breathing rate appears normal, no obvious gross increased work of breathing, gasping or wheezing  CV: no obvious cyanosis  MS: moves all visible extremities without noticeable abnormality  PSYCH/NEURO: pleasant and cooperative, no obvious depression or anxiety, speech and thought processing grossly intact  ASSESSMENT AND PLAN:   Upper respiratory tract infection, unspecified type -Based on her presentation PNA, pharyngitis, ear infection are not likely, hence abx have not been prescribed. -Have advised rest, fluids, OTC antihistamines, cough suppressants and mucinex. -RTC if no improvement in 10-14 days. -She will get a Covid test at a local pharmacy and apprise me of results.      I discussed the assessment and treatment plan with the patient. The patient was provided an opportunity to ask questions and all were answered. The patient agreed with the plan and demonstrated an understanding of the instructions.   The patient was advised to call back or seek an in-person evaluation if the symptoms worsen or if the condition fails to improve as anticipated.    [name], MD  [facility]

## 2019-10-04 LAB — SARS-COV-2, NAA 2 DAY TAT

## 2019-10-04 LAB — NOVEL CORONAVIRUS, NAA: SARS-CoV-2, NAA: NOT DETECTED

## 2019-10-05 ENCOUNTER — Encounter: Payer: Self-pay | Admitting: Internal Medicine

## 2019-10-05 ENCOUNTER — Other Ambulatory Visit: Payer: Self-pay | Admitting: Internal Medicine

## 2019-10-05 DIAGNOSIS — J0100 Acute maxillary sinusitis, unspecified: Secondary | ICD-10-CM

## 2019-10-05 MED ORDER — AZITHROMYCIN 500 MG PO TABS: 500.0000 mg | ORAL_TABLET | Freq: Every day | ORAL | 0 refills | Status: AC

## 2019-10-16 DIAGNOSIS — Z23 Encounter for immunization: Secondary | ICD-10-CM | POA: Diagnosis not present

## 2019-10-24 DIAGNOSIS — H2512 Age-related nuclear cataract, left eye: Secondary | ICD-10-CM | POA: Diagnosis not present

## 2019-10-26 DIAGNOSIS — H25812 Combined forms of age-related cataract, left eye: Secondary | ICD-10-CM | POA: Diagnosis not present

## 2019-10-29 ENCOUNTER — Encounter: Payer: Self-pay | Admitting: Internal Medicine

## 2019-10-29 DIAGNOSIS — Z23 Encounter for immunization: Secondary | ICD-10-CM | POA: Diagnosis not present

## 2019-10-30 DIAGNOSIS — Z96652 Presence of left artificial knee joint: Secondary | ICD-10-CM | POA: Diagnosis not present

## 2019-10-30 DIAGNOSIS — Z471 Aftercare following joint replacement surgery: Secondary | ICD-10-CM | POA: Diagnosis not present

## 2019-11-07 ENCOUNTER — Ambulatory Visit: Payer: Medicare Other

## 2019-11-14 ENCOUNTER — Ambulatory Visit: Payer: Medicare Other

## 2019-11-20 ENCOUNTER — Other Ambulatory Visit: Payer: No Typology Code available for payment source

## 2019-11-20 DIAGNOSIS — Z20822 Contact with and (suspected) exposure to covid-19: Secondary | ICD-10-CM

## 2019-11-21 LAB — SARS-COV-2, NAA 2 DAY TAT

## 2019-11-21 LAB — NOVEL CORONAVIRUS, NAA: SARS-CoV-2, NAA: NOT DETECTED

## 2019-12-04 ENCOUNTER — Telehealth (INDEPENDENT_AMBULATORY_CARE_PROVIDER_SITE_OTHER): Payer: Medicare Other | Admitting: Family Medicine

## 2019-12-04 DIAGNOSIS — J329 Chronic sinusitis, unspecified: Secondary | ICD-10-CM | POA: Diagnosis not present

## 2019-12-04 MED ORDER — AMOXICILLIN-POT CLAVULANATE 875-125 MG PO TABS: 1.0000 | ORAL_TABLET | Freq: Two times a day (BID) | ORAL | 0 refills | Status: DC

## 2019-12-04 NOTE — Progress Notes (Signed)
Virtual Visit via Telephone Note  I connected with [name] on 12/04/19 at 11:20 AM EST by telephone and verified that I am speaking with the correct person using two identifiers.   I discussed the limitations, risks, security and privacy concerns of performing an evaluation and management service by telephone and the availability of in person appointments. I also discussed with the patient that there may be a patient responsible charge related to this service. The patient expressed understanding and agreed to proceed.  Location patient: home, [place] Location provider: work or home office Participants present for the call: patient, provider Patient did not have a visit with me in the prior 7 days to address this/these issue(s).   History of Present Illness:  Acute telemedicine visit for for sinus issues: -Onset: over 2 weeks ago -had negative covid test -Symptoms include: thick yellow sinus congestion, cough, nose is stopped up -Denies: SOB, CP, fevers, headaches -Has tried: cough medication, vitamins, nettie pot -Pertinent past medical history: sinusitis -Pertinent medication allergies: knda -COVID-19 vaccine status: fully vaccinated + booster; has had flu shot   Observations/Objective: Patient sounds cheerful and well on the phone. I do not appreciate any SOB. Speech and thought processing are grossly intact. Patient reported vitals:  Assessment and Plan:  Sinusitis, unspecified chronicity, unspecified location  -we discussed possible serious and likely etiologies, options for evaluation and workup, limitations of telemedicine visit vs in person visit, treatment, treatment risks and precautions. Pt prefers to treat via telemedicine empirically rather than in person at this moment.  Query sinusitis given duration of symptoms with worsening and thick nasal sinus congestion.  With the holiday approaching, she opted for trial of empiric treatment with Augmentin 875 twice daily  for 7 to 10 days.  She agrees to seek prompt in person care if worsening, new symptoms arise, or if is not improving with treatment. Advised of options for inperson care in case PCP office not available. Did let the patient know that I only do telemedicine shifts for [facility] on Tuesdays and Thursdays and advised a follow up visit with PCP or at an [facility] if has further questions or concerns.   Follow Up Instructions:  I did not refer this patient for an OV with me in the next 24 hours for this/these issue(s).  I discussed the assessment and treatment plan with the patient. The patient was provided an opportunity to ask questions and all were answered. The patient agreed with the plan and demonstrated an understanding of the instructions.   I spent 15 minutes on this encounter.   Lucretia Kern, DO

## 2019-12-04 NOTE — Patient Instructions (Signed)
-  I sent the medication(s) we discussed to your pharmacy: Meds ordered this encounter  Medications  . amoxicillin-clavulanate (AUGMENTIN) 875-125 MG tablet    Sig: Take 1 tablet by mouth 2 (two) times daily.    Dispense:  20 tablet    Refill:  0     I hope you are feeling better soon!  Seek in person care promptly if your symptoms worsen, new concerns arise or you are not improving with treatment.  It was nice to meet you today. I help [facility] out with telemedicine visits on Tuesdays and Thursdays and am available for visits on those days. If you have any concerns or questions following this visit please schedule a follow up visit with your Primary Care doctor or seek care at a local urgent care clinic to avoid delays in care.   

## 2020-01-16 ENCOUNTER — Other Ambulatory Visit: Payer: No Typology Code available for payment source

## 2020-01-16 DIAGNOSIS — Z20822 Contact with and (suspected) exposure to covid-19: Secondary | ICD-10-CM | POA: Diagnosis not present

## 2020-01-17 LAB — NOVEL CORONAVIRUS, NAA: SARS-CoV-2, NAA: NOT DETECTED

## 2020-01-17 LAB — SARS-COV-2, NAA 2 DAY TAT

## 2020-01-18 ENCOUNTER — Encounter (HOSPITAL_COMMUNITY): Payer: Self-pay | Admitting: Emergency Medicine

## 2020-01-18 ENCOUNTER — Other Ambulatory Visit: Payer: Self-pay

## 2020-01-18 ENCOUNTER — Ambulatory Visit (INDEPENDENT_AMBULATORY_CARE_PROVIDER_SITE_OTHER): Payer: No Typology Code available for payment source

## 2020-01-18 ENCOUNTER — Ambulatory Visit (HOSPITAL_COMMUNITY)
Admission: EM | Admit: 2020-01-18 | Discharge: 2020-01-18 | Disposition: A | Discharge status: Home / Self Care | Payer: No Typology Code available for payment source | Attending: Urgent Care | Admitting: Urgent Care

## 2020-01-18 DIAGNOSIS — J019 Acute sinusitis, unspecified: Secondary | ICD-10-CM

## 2020-01-18 DIAGNOSIS — R059 Cough, unspecified: Secondary | ICD-10-CM | POA: Diagnosis not present

## 2020-01-18 DIAGNOSIS — R079 Chest pain, unspecified: Secondary | ICD-10-CM

## 2020-01-18 DIAGNOSIS — J3489 Other specified disorders of nose and nasal sinuses: Secondary | ICD-10-CM

## 2020-01-18 MED ORDER — CETIRIZINE HCL 10 MG PO TABS: 10.0000 mg | ORAL_TABLET | Freq: Every day | ORAL | 0 refills | Status: AC

## 2020-01-18 MED ORDER — PREDNISONE 20 MG PO TABS: ORAL_TABLET | ORAL | 0 refills | Status: DC

## 2020-01-18 NOTE — ED Provider Notes (Signed)
[place]   MRN: [id number] DOB: [birth year]  Subjective:   [name] is a 75 y.o. female presenting for 2-week history of persistent cough, sinus drainage, sinus pressure, postnasal drainage and throat discomfort.  Cough elicits some intermittent chest pain.  Denies history of COPD, asthma.  Patient is not a smoker.  She is not COVID vaccinated.  States that she did get tested 2 days ago and was negative.  She has multiple family members that also got tested and all were negative.  Of note, patient did undergo a course of Augmentin at the end of November, beginning of December.  She achieved symptom resolution for about 2 weeks before she became ill again.  No current facility-administered medications for this encounter.  Current Outpatient Medications:  .  amoxicillin-clavulanate (AUGMENTIN) 875-125 MG tablet, Take 1 tablet by mouth 2 (two) times daily., Disp: 20 tablet, Rfl: 0 .  levothyroxine (SYNTHROID) 25 MCG tablet, TAKE 1 TABLET (25 MCG TOTAL) BY MOUTH DAILY BEFORE BREAKFAST., Disp: 90 tablet, Rfl: 1 .  LORazepam (ATIVAN) 0.5 MG tablet, Take 1 tablet (0.5 mg total) by mouth daily as needed for anxiety., Disp: 90 tablet, Rfl: 1 .  Multiple Vitamin (MULTIVITAMIN) tablet, Take 1 tablet by mouth daily., Disp: , Rfl:  .  Multiple Vitamins-Minerals (CENTRUM SILVER 50+WOMEN PO), Take by mouth., Disp: , Rfl:  .  naproxen sodium (ALEVE) 220 MG tablet, Take 220 mg by mouth 2 (two) times daily as needed., Disp: , Rfl:  .  Omega-3 Fatty Acids (FISH OIL) 1200 MG CAPS, Take 1 capsule by mouth daily., Disp: , Rfl:  .  Polyethyl Glycol-Propyl Glycol (SYSTANE) 0.4-0.3 % SOLN, Apply 1 drop to eye 3 (three) times daily., Disp: , Rfl:  .  vitamin C (ASCORBIC ACID) 500 MG tablet, Take 500 mg by mouth daily., Disp: , Rfl:  .  vitamin E 1000 UNIT capsule, Take 1,000 Units by mouth daily. , Disp: , Rfl:  .  zolpidem (AMBIEN) 10 MG tablet, TAKE 1 TABLET BY MOUTH AT BEDTIME AS NEEDED  FOR SLEEP, Disp: 30 tablet, Rfl: 5   No Known Allergies  Past Medical History:  Diagnosis Date  . Anxiety   . Arthritis    Knees,back,  . Hypothyroidism   . Osteopenia      Past Surgical History:  Procedure Laterality Date  . COLONOSCOPY  2014  . TOTAL KNEE ARTHROPLASTY Left 11/06/2018   Procedure: LEFT TOTAL KNEE ARTHROPLASTY;  Surgeon: Frederik Pear, MD;  Location: [facility];  Service: Orthopedics;  Laterality: Left;    Family History  Problem Relation Age of Onset  . Heart disease Mother   . Arthritis Mother   . Hypertension Father   . Arthritis Father     Social History   Tobacco Use  . Smoking status: Former Smoker    Packs/day: 0.50    Years: 30.00    Pack years: 15.00    Types: Cigarettes    Quit date: 03/12/2011    Years since quitting: 8.8  . Smokeless tobacco: Never Used  Vaping Use  . Vaping Use: Never used  Substance Use Topics  . Alcohol use: Yes    Comment: 2 glasses of wine per day  . Drug use: No    ROS   Objective:   Vitals: BP (!) 156/69 (BP Location: Left Arm)   Pulse 76   Temp 97.7 F (36.5 C) (Oral)   Resp 16   SpO2 100%   Physical Exam  Constitutional:      General: She is not in acute distress.    Appearance: Normal appearance. She is well-developed. She is not ill-appearing, toxic-appearing or diaphoretic.  HENT:     Head: Normocephalic and atraumatic.     Nose: Nose normal.     Mouth/Throat:     Mouth: Mucous membranes are moist.  Eyes:     Extraocular Movements: Extraocular movements intact.     Pupils: Pupils are equal, round, and reactive to light.  Cardiovascular:     Rate and Rhythm: Normal rate and regular rhythm.     Pulses: Normal pulses.     Heart sounds: Normal heart sounds. No murmur heard. No friction rub. No gallop.   Pulmonary:     Effort: Pulmonary effort is normal. No respiratory distress.     Breath sounds: No stridor. No wheezing, rhonchi or rales.  Skin:    General: Skin is warm and dry.      Findings: No rash.  Neurological:     Mental Status: She is alert and oriented to person, place, and time.  Psychiatric:        Mood and Affect: Mood normal.        Behavior: Behavior normal.        Thought Content: Thought content normal.        Judgment: Judgment normal.     DG Chest 2 View  Result Date: 01/18/2020 CLINICAL DATA:  Cough.  Chest pain. EXAM: CHEST - 2 VIEW COMPARISON:  10/31/2018 FINDINGS: The heart size and mediastinal contours are within normal limits. Both lungs are clear. The visualized skeletal structures are unremarkable. IMPRESSION: No active cardiopulmonary disease. Electronically Signed   By: Constance Holster M.D.   On: 01/18/2020 17:48    Assessment and Plan :   PDMP not reviewed this encounter.  1. Acute non-recurrent sinusitis, unspecified location   2. Cough   3. Sinus pressure     Start Zyrtec daily.  Start oral prednisone course.  We will hold off on using another round of antibiotics as she just finished 1 month ago.  After the prednisone course, will recommend recheck, consider antibiotic course.  Counseled patient on potential for adverse effects with medications prescribed/recommended today, ER and return-to-clinic precautions discussed, patient verbalized understanding.    Jaynee Eagles, PA-C 01/18/20 1753

## 2020-01-18 NOTE — ED Triage Notes (Signed)
Pt presents with productive cough and sinus drainage. States symptoms started on 01/06/20. States was tested for COVID on Wednesday with negative test result.

## 2020-01-18 NOTE — Discharge Instructions (Signed)
Lets use a prednisone course to help with your rhinitis.  Please start taking Zyrtec daily.  Once you are done with the prednisone start Flonase again.  If your symptoms persist despite using the prednisone course then come back to our clinic and we will trial a different antibiotic than the one you just took at the beginning of December.

## 2020-01-21 ENCOUNTER — Telehealth: Payer: No Typology Code available for payment source | Admitting: Family Medicine

## 2020-01-22 DIAGNOSIS — Z1231 Encounter for screening mammogram for malignant neoplasm of breast: Secondary | ICD-10-CM | POA: Diagnosis not present

## 2020-01-22 LAB — HM MAMMOGRAPHY

## 2020-02-06 ENCOUNTER — Encounter: Payer: Self-pay | Admitting: Internal Medicine

## 2020-02-18 DIAGNOSIS — L821 Other seborrheic keratosis: Secondary | ICD-10-CM | POA: Diagnosis not present

## 2020-02-18 DIAGNOSIS — L905 Scar conditions and fibrosis of skin: Secondary | ICD-10-CM | POA: Diagnosis not present

## 2020-02-18 DIAGNOSIS — L728 Other follicular cysts of the skin and subcutaneous tissue: Secondary | ICD-10-CM | POA: Diagnosis not present

## 2020-02-18 DIAGNOSIS — L111 Transient acantholytic dermatosis [Grover]: Secondary | ICD-10-CM | POA: Diagnosis not present

## 2020-02-18 DIAGNOSIS — L570 Actinic keratosis: Secondary | ICD-10-CM | POA: Diagnosis not present

## 2020-02-18 DIAGNOSIS — Z85828 Personal history of other malignant neoplasm of skin: Secondary | ICD-10-CM | POA: Diagnosis not present

## 2020-02-18 DIAGNOSIS — D229 Melanocytic nevi, unspecified: Secondary | ICD-10-CM | POA: Diagnosis not present

## 2020-02-18 DIAGNOSIS — L304 Erythema intertrigo: Secondary | ICD-10-CM | POA: Diagnosis not present

## 2020-02-18 DIAGNOSIS — L819 Disorder of pigmentation, unspecified: Secondary | ICD-10-CM | POA: Diagnosis not present

## 2020-02-18 DIAGNOSIS — L814 Other melanin hyperpigmentation: Secondary | ICD-10-CM | POA: Diagnosis not present

## 2020-03-03 ENCOUNTER — Other Ambulatory Visit: Payer: Self-pay | Admitting: Internal Medicine

## 2020-04-23 DIAGNOSIS — H903 Sensorineural hearing loss, bilateral: Secondary | ICD-10-CM | POA: Diagnosis not present

## 2020-04-23 DIAGNOSIS — J0141 Acute recurrent pansinusitis: Secondary | ICD-10-CM | POA: Diagnosis not present

## 2020-04-23 DIAGNOSIS — J343 Hypertrophy of nasal turbinates: Secondary | ICD-10-CM | POA: Diagnosis not present

## 2020-05-08 ENCOUNTER — Other Ambulatory Visit: Payer: Self-pay | Admitting: Internal Medicine

## 2020-05-08 DIAGNOSIS — G4700 Insomnia, unspecified: Secondary | ICD-10-CM

## 2020-05-13 NOTE — Telephone Encounter (Signed)
  Zolpidem Tartrate TAKE 1 TABLET BY MOUTH EVERY DAY AT BEDTIME AS NEEDED FOR SLEEP   LORazepam 0.5 MG TAKE 1 TABLET BY MOUTH EVERY DAY AS NEEDED FOR ANXIETY       [id number] [address] [place] - [place] Phone:  [phone]  Fax:  [phone]

## 2020-05-19 DIAGNOSIS — H1045 Other chronic allergic conjunctivitis: Secondary | ICD-10-CM | POA: Diagnosis not present

## 2020-05-19 DIAGNOSIS — H04123 Dry eye syndrome of bilateral lacrimal glands: Secondary | ICD-10-CM | POA: Diagnosis not present

## 2020-05-19 DIAGNOSIS — H0102B Squamous blepharitis left eye, upper and lower eyelids: Secondary | ICD-10-CM | POA: Diagnosis not present

## 2020-05-19 DIAGNOSIS — H0102A Squamous blepharitis right eye, upper and lower eyelids: Secondary | ICD-10-CM | POA: Diagnosis not present

## 2020-05-19 DIAGNOSIS — Z961 Presence of intraocular lens: Secondary | ICD-10-CM | POA: Diagnosis not present

## 2020-05-19 DIAGNOSIS — H40023 Open angle with borderline findings, high risk, bilateral: Secondary | ICD-10-CM | POA: Diagnosis not present

## 2020-05-30 ENCOUNTER — Other Ambulatory Visit: Payer: Self-pay | Admitting: Internal Medicine

## 2020-06-10 ENCOUNTER — Encounter: Payer: Self-pay | Admitting: Internal Medicine

## 2020-06-17 DIAGNOSIS — Z23 Encounter for immunization: Secondary | ICD-10-CM | POA: Diagnosis not present

## 2020-07-06 ENCOUNTER — Other Ambulatory Visit: Payer: Self-pay | Admitting: Internal Medicine

## 2020-07-06 DIAGNOSIS — G4700 Insomnia, unspecified: Secondary | ICD-10-CM

## 2020-07-06 DIAGNOSIS — U071 COVID-19: Secondary | ICD-10-CM | POA: Diagnosis not present

## 2020-08-24 ENCOUNTER — Other Ambulatory Visit: Payer: Self-pay | Admitting: Internal Medicine

## 2020-08-27 ENCOUNTER — Encounter: Payer: Self-pay | Admitting: Internal Medicine

## 2020-08-28 ENCOUNTER — Telehealth (INDEPENDENT_AMBULATORY_CARE_PROVIDER_SITE_OTHER): Payer: Medicare Other | Admitting: Internal Medicine

## 2020-08-28 ENCOUNTER — Encounter: Payer: Self-pay | Admitting: Internal Medicine

## 2020-08-28 VITALS — BP 113/65 | HR 82 | Wt 146.0 lb

## 2020-08-28 DIAGNOSIS — U071 COVID-19: Secondary | ICD-10-CM

## 2020-08-28 DIAGNOSIS — K649 Unspecified hemorrhoids: Secondary | ICD-10-CM

## 2020-08-28 DIAGNOSIS — G4700 Insomnia, unspecified: Secondary | ICD-10-CM | POA: Diagnosis not present

## 2020-08-28 MED ORDER — MOLNUPIRAVIR EUA 200MG CAPSULE
4.0000 | ORAL_CAPSULE | Freq: Two times a day (BID) | ORAL | 0 refills | Status: AC
Start: 2020-08-28 — End: 2020-09-02

## 2020-08-28 MED ORDER — LORAZEPAM 0.5 MG PO TABS: ORAL_TABLET | ORAL | 0 refills | Status: DC

## 2020-08-28 MED ORDER — ZOLPIDEM TARTRATE 10 MG PO TABS
ORAL_TABLET | ORAL | 0 refills | Status: DC
Start: 2020-08-28 — End: 2021-02-26

## 2020-08-28 MED ORDER — HYDROCORTISONE ACETATE 25 MG RE SUPP: 25.0000 mg | Freq: Two times a day (BID) | RECTAL | 0 refills | Status: DC

## 2020-08-28 NOTE — Progress Notes (Signed)
Virtual Visit via Video Note  I connected with [name] on 08/28/20 at  4:00 PM EDT by a video enabled telemedicine application and verified that I am speaking with the correct person using two identifiers.  Location patient: home Location provider: work office Persons participating in the virtual visit: patient, provider  I discussed the limitations of evaluation and management by telemedicine and the availability of in person appointments. The patient expressed understanding and agreed to proceed.   HPI: She has scheduled this visit for for medication refills and also to tell me that she has tested positive for COVID.  She was in [place] last week upon her return she was called by her daughter to inform that she had tested positive for COVID.  She and her husband both tested positive the next day.  Her symptoms have been congestion, runny nose, productive cough and sneezing.  Today is day 4 of symptoms.  She is requesting a refill of Anusol suppositories for her hemorrhoids.  She is also requesting a refill of her Ambien and lorazepam that she takes for her insomnia.   ROS: Constitutional: Denies fever, chills, diaphoresis, appetite change. HEENT: Denies photophobia, eye pain, redness,  mouth sores, trouble swallowing, neck pain, neck stiffness and tinnitus.   Respiratory: Denies SOB, DOE, chest tightness,  and wheezing.   Cardiovascular: Denies chest pain, palpitations and leg swelling.  Gastrointestinal: Denies nausea, vomiting, abdominal pain, diarrhea, constipation, blood in stool and abdominal distention.  Genitourinary: Denies dysuria, urgency, frequency, hematuria, flank pain and difficulty urinating.  Endocrine: Denies: hot or cold intolerance, sweats, changes in hair or nails, polyuria, polydipsia. Musculoskeletal: Denies myalgias, back pain, joint swelling, arthralgias and gait problem.  Skin: Denies pallor, rash and wound.  Neurological: Denies dizziness,  seizures, syncope, weakness, light-headedness, numbness and headaches.  Hematological: Denies adenopathy. Easy bruising, personal or family bleeding history  Psychiatric/Behavioral: Denies suicidal ideation, mood changes, confusion, nervousness, sleep disturbance and agitation   Past Medical History:  Diagnosis Date   Anxiety    Arthritis    Knees,back,   Hypothyroidism    Osteopenia     Past Surgical History:  Procedure Laterality Date   COLONOSCOPY  2014   TOTAL KNEE ARTHROPLASTY Left 11/06/2018   Procedure: LEFT TOTAL KNEE ARTHROPLASTY;  Surgeon: Frederik Pear, MD;  Location: [facility];  Service: Orthopedics;  Laterality: Left;    Family History  Problem Relation Age of Onset   Heart disease Mother    Arthritis Mother    Hypertension Father    Arthritis Father     SOCIAL HX:   reports that she quit smoking about 9 years ago. Her smoking use included cigarettes. She has a 15.00 pack-year smoking history. She has never used smokeless tobacco. She reports current alcohol use. She reports that she does not use drugs.   Current Outpatient Medications:    cetirizine (ZYRTEC ALLERGY) 10 MG tablet, Take 1 tablet (10 mg total) by mouth daily., Disp: 30 tablet, Rfl: 0   hydrocortisone (ANUSOL-HC) 25 MG suppository, Place 1 suppository (25 mg total) rectally 2 (two) times daily., Disp: 12 suppository, Rfl: 0   levothyroxine (SYNTHROID) 25 MCG tablet, TAKE 1 TABLET BY MOUTH EVERY DAY BEFORE BREAKFAST, Disp: 90 tablet, Rfl: 0   molnupiravir EUA 200 mg CAPS, Take 4 capsules (800 mg total) by mouth 2 (two) times daily for 5 days., Disp: 40 capsule, Rfl: 0   Multiple Vitamin (MULTIVITAMIN) tablet, Take 1 tablet by mouth daily., Disp: , Rfl:  Multiple Vitamins-Minerals (CENTRUM SILVER 50+WOMEN PO), Take by mouth., Disp: , Rfl:    naproxen sodium (ALEVE) 220 MG tablet, Take 220 mg by mouth 2 (two) times daily as needed., Disp: , Rfl:    Omega-3 Fatty Acids (FISH OIL) 1200 MG CAPS, Take 1  capsule by mouth daily., Disp: , Rfl:    Polyethyl Glycol-Propyl Glycol 0.4-0.3 % SOLN, Apply 1 drop to eye 3 (three) times daily., Disp: , Rfl:    vitamin C (ASCORBIC ACID) 500 MG tablet, Take 500 mg by mouth daily., Disp: , Rfl:    vitamin E 1000 UNIT capsule, Take 1,000 Units by mouth daily. , Disp: , Rfl:    LORazepam (ATIVAN) 0.5 MG tablet, TAKE 1 TABLET BY MOUTH EVERY DAY AS NEEDED FOR ANXIETY, Disp: 90 tablet, Rfl: 0   zolpidem (AMBIEN) 10 MG tablet, TAKE 1 TABLET BY MOUTH EVERY DAY AT BEDTIME AS NEEDED FOR SLEEP, Disp: 90 tablet, Rfl: 0  EXAM:   VITALS per patient if applicable: None reported  GENERAL: alert, oriented, appears well and in no acute distress  HEENT: atraumatic, conjunttiva clear, no obvious abnormalities on inspection of external nose and ears  NECK: normal movements of the head and neck  LUNGS: on inspection no signs of respiratory distress, breathing rate appears normal, no obvious gross increased work of breathing, gasping or wheezing  CV: no obvious cyanosis  MS: moves all visible extremities without noticeable abnormality  PSYCH/NEURO: pleasant and cooperative, no obvious depression or anxiety, speech and thought processing grossly intact  ASSESSMENT AND PLAN:   COVID-19  - Plan: molnupiravir EUA 200 mg CAPS -She has had all 4 vaccines, we have discussed symptoms which should prompt emergency room visit.  Otherwise she will reach out to [place] next week if no improvement. She may also use OTC medications such as pain relievers, decongestants, antihistamines and guaifenesin.  Insomnia, unspecified type  - Plan: LORazepam (ATIVAN) 0.5 MG tablet, zolpidem (AMBIEN) 10 MG tablet  Hemorrhoids, unspecified hemorrhoid type  - Plan: hydrocortisone (ANUSOL-HC) 25 MG suppository     I discussed the assessment and treatment plan with the patient. The patient was provided an opportunity to ask questions and all were answered. The patient agreed with the plan and  demonstrated an understanding of the instructions.   The patient was advised to call back or seek an in-person evaluation if the symptoms worsen or if the condition fails to improve as anticipated.    [name], MD  [facility] at [facility]

## 2020-09-10 ENCOUNTER — Other Ambulatory Visit: Payer: Self-pay

## 2020-09-11 ENCOUNTER — Encounter: Payer: Self-pay | Admitting: Internal Medicine

## 2020-09-11 ENCOUNTER — Ambulatory Visit (INDEPENDENT_AMBULATORY_CARE_PROVIDER_SITE_OTHER): Payer: Medicare Other | Admitting: Internal Medicine

## 2020-09-11 VITALS — BP 110/80 | HR 75 | Temp 97.9°F | Ht 61.5 in | Wt 147.3 lb

## 2020-09-11 DIAGNOSIS — M8580 Other specified disorders of bone density and structure, unspecified site: Secondary | ICD-10-CM

## 2020-09-11 DIAGNOSIS — E785 Hyperlipidemia, unspecified: Secondary | ICD-10-CM | POA: Diagnosis not present

## 2020-09-11 DIAGNOSIS — E039 Hypothyroidism, unspecified: Secondary | ICD-10-CM | POA: Diagnosis not present

## 2020-09-11 DIAGNOSIS — G4700 Insomnia, unspecified: Secondary | ICD-10-CM

## 2020-09-11 DIAGNOSIS — Z23 Encounter for immunization: Secondary | ICD-10-CM

## 2020-09-11 DIAGNOSIS — Z0000 Encounter for general adult medical examination without abnormal findings: Secondary | ICD-10-CM | POA: Diagnosis not present

## 2020-09-11 LAB — COMPREHENSIVE METABOLIC PANEL
ALT: 12 U/L (ref 0–35)
AST: 16 U/L (ref 0–37)
Albumin: 4.2 g/dL (ref 3.5–5.2)
Alkaline Phosphatase: 63 U/L (ref 39–117)
BUN: 18 mg/dL (ref 6–23)
CO2: 27 mEq/L (ref 19–32)
Calcium: 8.9 mg/dL (ref 8.4–10.5)
Chloride: 100 mEq/L (ref 96–112)
Creatinine, Ser: 0.7 mg/dL (ref 0.40–1.20)
GFR: 85.37 mL/min (ref >60.00)
Glucose, Bld: 84 mg/dL (ref 70–99)
Potassium: 4 mEq/L (ref 3.5–5.1)
Sodium: 135 mEq/L (ref 135–145)
Total Bilirubin: 0.8 mg/dL (ref 0.2–1.2)
Total Protein: 6.8 g/dL (ref 6.0–8.3)

## 2020-09-11 LAB — CBC WITH DIFFERENTIAL/PLATELET
Basophils Absolute: 0 10*3/uL (ref 0.0–0.1)
Basophils Relative: 0.9 % (ref 0.0–3.0)
Eosinophils Absolute: 0.1 10*3/uL (ref 0.0–0.7)
Eosinophils Relative: 1.2 % (ref 0.0–5.0)
HCT: 39.7 % (ref 36.0–46.0)
Hemoglobin: 13.5 g/dL (ref 12.0–15.0)
Lymphocytes Relative: 30.7 % (ref 12.0–46.0)
Lymphs Abs: 1.4 10*3/uL (ref 0.7–4.0)
MCHC: 33.9 g/dL (ref 30.0–36.0)
MCV: 99.4 fl (ref 78.0–100.0)
Monocytes Absolute: 0.3 10*3/uL (ref 0.1–1.0)
Monocytes Relative: 7.7 % (ref 3.0–12.0)
Neutro Abs: 2.6 10*3/uL (ref 1.4–7.7)
Neutrophils Relative %: 59.5 % (ref 43.0–77.0)
Platelets: 295 10*3/uL (ref 150.0–400.0)
RBC: 4 Mil/uL (ref 3.87–5.11)
RDW: 12.2 % (ref 11.5–15.5)
WBC: 4.4 10*3/uL (ref 4.0–10.5)

## 2020-09-11 LAB — LIPID PANEL
Cholesterol: 195 mg/dL (ref 0–200)
HDL: 70.6 mg/dL (ref >39.00)
LDL Cholesterol: 105 mg/dL — ABNORMAL HIGH (ref 0–99)
NonHDL: 123.98
Total CHOL/HDL Ratio: 3
Triglycerides: 97 mg/dL (ref 0.0–149.0)
VLDL: 19.4 mg/dL (ref 0.0–40.0)

## 2020-09-11 LAB — TSH: TSH: 2.82 u[IU]/mL (ref 0.35–5.50)

## 2020-09-11 NOTE — Patient Instructions (Signed)
-  Nice seeing you today!!  -Lab work today; will notify you once results are available.  -Flu vaccine today.  -Remember your shingles vaccine at the pharmacy.  -Schedule follow up in 6 months.

## 2020-09-11 NOTE — Addendum Note (Signed)
Addended by: Westley Hummer B on: 09/11/2020 04:23 PM   Modules accepted: Orders

## 2020-09-11 NOTE — Progress Notes (Signed)
Established Patient Office Visit     This visit occurred during the SARS-CoV-2 public health emergency.  Safety protocols were in place, including screening questions prior to the visit, additional usage of staff PPE, and extensive cleaning of exam room while observing appropriate contact time as indicated for disinfecting solutions.    CC/Reason for Visit: Subsequent Medicare wellness visit and follow-up chronic medical conditions  HPI: [name] is a 75 y.o. female who is coming in today for the above mentioned reasons. Past Medical History is significant for: Osteopenia per bone density test in 2019, hypothyroidism, insomnia, she has had bilateral knee replacements, she also had elective cataract repair bilaterally in 2021.  She is feeling well after recovering from [place] earlier this month.  She is overdue for flu and shingles vaccinations.  She has routine eye and dental care.  She has had decreased hearing of her left ear and recently had an hearing test, follow-up in 1 year was recommended.  She had a colonoscopy in 2014, and negative mammogram in January 2022.  She has no acute concerns today.   Past Medical/Surgical History: Past Medical History:  Diagnosis Date   Anxiety    Arthritis    Knees,back,   Hypothyroidism    Osteopenia     Past Surgical History:  Procedure Laterality Date   COLONOSCOPY  2014   TOTAL KNEE ARTHROPLASTY Left 11/06/2018   Procedure: LEFT TOTAL KNEE ARTHROPLASTY;  Surgeon: Frederik Pear, MD;  Location: [facility];  Service: Orthopedics;  Laterality: Left;    Social History:  reports that she quit smoking about 9 years ago. Her smoking use included cigarettes. She has a 15.00 pack-year smoking history. She has never used smokeless tobacco. She reports current alcohol use. She reports that she does not use drugs.  Allergies: No Known Allergies  Family History:  Family History  Problem Relation Age of Onset   Heart disease Mother     Arthritis Mother    Hypertension Father    Arthritis Father      Current Outpatient Medications:    cetirizine (ZYRTEC ALLERGY) 10 MG tablet, Take 1 tablet (10 mg total) by mouth daily., Disp: 30 tablet, Rfl: 0   hydrocortisone (ANUSOL-HC) 25 MG suppository, Place 1 suppository (25 mg total) rectally 2 (two) times daily., Disp: 12 suppository, Rfl: 0   levothyroxine (SYNTHROID) 25 MCG tablet, TAKE 1 TABLET BY MOUTH EVERY DAY BEFORE BREAKFAST, Disp: 90 tablet, Rfl: 0   LORazepam (ATIVAN) 0.5 MG tablet, TAKE 1 TABLET BY MOUTH EVERY DAY AS NEEDED FOR ANXIETY, Disp: 90 tablet, Rfl: 0   Multiple Vitamins-Minerals (CENTRUM SILVER 50+WOMEN PO), Take by mouth., Disp: , Rfl:    naproxen sodium (ALEVE) 220 MG tablet, Take 220 mg by mouth 2 (two) times daily as needed., Disp: , Rfl:    Omega-3 Fatty Acids (FISH OIL) 1200 MG CAPS, Take 1 capsule by mouth daily., Disp: , Rfl:    Polyethyl Glycol-Propyl Glycol 0.4-0.3 % SOLN, Apply 1 drop to eye 3 (three) times daily., Disp: , Rfl:    vitamin C (ASCORBIC ACID) 500 MG tablet, Take 500 mg by mouth daily., Disp: , Rfl:    vitamin E 1000 UNIT capsule, Take 1,000 Units by mouth daily. , Disp: , Rfl:    zolpidem (AMBIEN) 10 MG tablet, TAKE 1 TABLET BY MOUTH EVERY DAY AT BEDTIME AS NEEDED FOR SLEEP, Disp: 90 tablet, Rfl: 0  Review of Systems:  Constitutional: Denies fever, chills, diaphoresis, appetite change and  fatigue.  HEENT: Denies photophobia, eye pain, redness, hearing loss, ear pain, congestion, sore throat, rhinorrhea, sneezing, mouth sores, trouble swallowing, neck pain, neck stiffness and tinnitus.   Respiratory: Denies SOB, DOE, cough, chest tightness,  and wheezing.   Cardiovascular: Denies chest pain, palpitations and leg swelling.  Gastrointestinal: Denies nausea, vomiting, abdominal pain, diarrhea, constipation, blood in stool and abdominal distention.  Genitourinary: Denies dysuria, urgency, frequency, hematuria, flank pain and difficulty  urinating.  Endocrine: Denies: hot or cold intolerance, sweats, changes in hair or nails, polyuria, polydipsia. Musculoskeletal: Denies myalgias, back pain, joint swelling, arthralgias and gait problem.  Skin: Denies pallor, rash and wound.  Neurological: Denies dizziness, seizures, syncope, weakness, light-headedness, numbness and headaches.  Hematological: Denies adenopathy. Easy bruising, personal or family bleeding history  Psychiatric/Behavioral: Denies suicidal ideation, mood changes, confusion, nervousness, sleep disturbance and agitation    Physical Exam: Vitals:   09/11/20 1039  BP: 110/80  Pulse: 75  Temp: 97.9 F (36.6 C)  TempSrc: Oral  SpO2: 98%  Weight: 147 lb 4.8 oz (66.8 kg)  Height: 5' 1.5" (1.562 m)    Body mass index is 27.38 kg/m.   Constitutional: NAD, calm, comfortable Eyes: PERRL, lids and conjunctivae normal ENMT: Mucous membranes are moist. Posterior pharynx clear of any exudate or lesions. Normal dentition. Tympanic membrane is pearly white, no erythema or bulging. Neck: normal, supple, no masses, no thyromegaly Respiratory: clear to auscultation bilaterally, no wheezing, no crackles. Normal respiratory effort. No accessory muscle use.  Cardiovascular: Regular rate and rhythm, no murmurs / rubs / gallops. No extremity edema. 2+ pedal pulses. No carotid bruits.  Abdomen: no tenderness, no masses palpated. No hepatosplenomegaly. Bowel sounds positive.  Musculoskeletal: no clubbing / cyanosis. No joint deformity upper and lower extremities. Good ROM, no contractures. Normal muscle tone.  Skin: no rashes, lesions, ulcers. No induration Neurologic: CN 2-12 grossly intact. Sensation intact, DTR normal. Strength 5/5 in all 4.  Psychiatric: Normal judgment and insight. Alert and oriented x 3. Normal mood.    Subsequent Medicare wellness visit   1. Risk factors, based on past  M,S,F -cardiovascular disease risk factors include age only   2.  Physical  activities: She is quite physically active   3.  Depression/mood: Stable, not depressed   4.  Hearing: Decreased hearing of left ear with recent formal audiological examination   5.  ADL's: Independent in all ADLs   6.  Fall risk: Low fall risk   7.  Home safety: No problems identified   8.  Height weight, and visual acuity: height and weight as above, vision:  Vision Screening   Right eye Left eye Both eyes  Without correction '20/25 20/40 20/20 '$  With correction        9.  Counseling: Advise she update her vaccination status   10. Lab orders based on risk factors: Laboratory update will be reviewed   11. Referral : None today   12. Care plan: Follow-up with me in 6 months   13. Cognitive assessment: No cognitive impairment   14. Screening: Patient provided with a written and personalized 5-10 year screening schedule in the AVS. yes   15. Provider List Update: PCP only  16. Advance Directives: Full code   17. Opioids: Patient is not on any opioid prescriptions and has no risk factors for a substance use disorder.   [place] Office Visit from 08/22/2019 in [facility] [place]  PHQ-9 Total Score 1       Fall Risk  08/22/2019 08/08/2018 03/23/2018 06/21/2017 06/16/2016  Falls in the past year? 0 0 0 No No  Comment - - - fail in [place] in January  -  Number falls in past yr: 0 0 0 - -  Injury with Fall? 0 0 0 - -     Impression and Plan:  Encounter for subsequent annual wellness visit in Medicare patient -She has routine eye and dental care. -Flu vaccine today, she will get shingles vaccine at pharmacy, otherwise immunizations are up-to-date. -Labs will be updated today. -Healthy lifestyle discussed in detail. -She had a colonoscopy in 2014 and is a 10-year callback. -She will be due for mammogram in January 2023. -She has elected to defer cancer screening due to age. -DEXA scan requested today.  Hyperlipidemia, unspecified hyperlipidemia type  -  Plan: Comprehensive metabolic panel, Lipid panel  Insomnia, unspecified type -On Ambien and lorazepam as needed.  Acquired hypothyroidism  - Plan: TSH -On levothyroxine for immediately.  Osteopenia, unspecified location -DEXA scan requested.  Need for influenza vaccination -Flu vaccine administered today.    Patient Instructions  -Nice seeing you today!!  -Lab work today; will notify you once results are available.  -Flu vaccine today.  -Remember your shingles vaccine at the pharmacy.  -Schedule follow up in 6 months.      [name], MD [facility] at [facility]

## 2020-09-11 NOTE — Addendum Note (Signed)
Addended by: Amanda Cockayne on: 09/11/2020 11:20 AM   Modules accepted: Orders

## 2020-09-12 ENCOUNTER — Other Ambulatory Visit: Payer: Self-pay | Admitting: Internal Medicine

## 2020-09-12 DIAGNOSIS — K649 Unspecified hemorrhoids: Secondary | ICD-10-CM

## 2020-09-24 ENCOUNTER — Encounter: Payer: Self-pay | Admitting: Internal Medicine

## 2020-11-04 DIAGNOSIS — M1712 Unilateral primary osteoarthritis, left knee: Secondary | ICD-10-CM | POA: Diagnosis not present

## 2020-11-22 ENCOUNTER — Other Ambulatory Visit: Payer: Self-pay | Admitting: Internal Medicine

## 2020-11-25 ENCOUNTER — Other Ambulatory Visit: Payer: Self-pay | Admitting: Internal Medicine

## 2020-11-25 DIAGNOSIS — G4700 Insomnia, unspecified: Secondary | ICD-10-CM

## 2020-12-26 ENCOUNTER — Telehealth: Payer: Self-pay | Admitting: Internal Medicine

## 2020-12-26 NOTE — Telephone Encounter (Signed)
Patient calling in with respiratory symptoms: Shortness of breath, chest pain, palpitations or other red words send to Triage  Does the patient have a fever over 100, cough, congestion, sore throat, runny nose, lost of taste/smell (please list symptoms that patient has)? Congestion, patient says possible sinus infection  What date did symptoms start? December 13th (If over 5 days ago, pt may be scheduled for in person visit)  Have you tested for Covid in the last 5 days? Yes   If yes, was it positive []  OR negative [x] ? If positive in the last 5 days, please schedule virtual visit now. If negative, schedule for an in person OV with the next available provider if PCP has no openings. Please also let patient know they will be tested again (follow the script below)  "you will have to arrive 66mins prior to your appt time to be Covid tested. Please park in back of office at the [facility] & call [phone] to let the staff know you have arrived. A staff member will meet you at your car to do a rapid covid test. Once the test has resulted you will be notified by phone of your results to determine if appt will remain an in person visit or be converted to a virtual/phone visit. If you arrive less than 41mins before your appt time, your visit will be automatically converted to virtual & any recommended testing will happen AFTER the visit."   Patient has not been tested for flu but has tested for COVID she says.

## 2020-12-31 ENCOUNTER — Ambulatory Visit (INDEPENDENT_AMBULATORY_CARE_PROVIDER_SITE_OTHER): Payer: Medicare Other | Admitting: Internal Medicine

## 2020-12-31 ENCOUNTER — Encounter: Payer: Self-pay | Admitting: Internal Medicine

## 2020-12-31 VITALS — BP 122/72 | HR 82 | Temp 97.7°F | Ht 61.5 in | Wt 152.4 lb

## 2020-12-31 DIAGNOSIS — J209 Acute bronchitis, unspecified: Secondary | ICD-10-CM

## 2020-12-31 DIAGNOSIS — R6889 Other general symptoms and signs: Secondary | ICD-10-CM

## 2020-12-31 LAB — POCT INFLUENZA A/B
Influenza A, POC: NEGATIVE
Influenza B, POC: NEGATIVE

## 2020-12-31 LAB — POC COVID19 BINAXNOW: SARS Coronavirus 2 Ag: NEGATIVE

## 2020-12-31 MED ORDER — AZITHROMYCIN 250 MG PO TABS: ORAL_TABLET | ORAL | 0 refills | Status: AC

## 2020-12-31 NOTE — Progress Notes (Signed)
Acute office Visit     This visit occurred during the SARS-CoV-2 public health emergency.  Safety protocols were in place, including screening questions prior to the visit, additional usage of staff PPE, and extensive cleaning of exam room while observing appropriate contact time as indicated for disinfecting solutions.    CC/Reason for Visit: URI symptoms  HPI: [name] is a 75 y.o. female who is coming in today for the above mentioned reasons.  For 2-1/2 weeks she has been experiencing runny nose, congestion, postnasal drip, cough with yellow sputum production and chills.  The chills have subsided.  The cough continues to be problematic.  She has no sinus pain or pressure.  Past Medical/Surgical History: Past Medical History:  Diagnosis Date   Anxiety    Arthritis    Knees,back,   Hypothyroidism    Osteopenia     Past Surgical History:  Procedure Laterality Date   COLONOSCOPY  2014   TOTAL KNEE ARTHROPLASTY Left 11/06/2018   Procedure: LEFT TOTAL KNEE ARTHROPLASTY;  Surgeon: Frederik Pear, MD;  Location: [facility];  Service: Orthopedics;  Laterality: Left;    Social History:  reports that she quit smoking about 9 years ago. Her smoking use included cigarettes. She has a 15.00 pack-year smoking history. She has never used smokeless tobacco. She reports current alcohol use. She reports that she does not use drugs.  Allergies: No Known Allergies  Family History:  Family History  Problem Relation Age of Onset   Heart disease Mother    Arthritis Mother    Hypertension Father    Arthritis Father      Current Outpatient Medications:    azithromycin (ZITHROMAX) 250 MG tablet, Take 2 tablets on day 1, then 1 tablet daily on days 2 through 5, Disp: 6 tablet, Rfl: 0   cetirizine (ZYRTEC ALLERGY) 10 MG tablet, Take 1 tablet (10 mg total) by mouth daily., Disp: 30 tablet, Rfl: 0   hydrocortisone (ANUSOL-HC) 25 MG suppository, PLACE 1 SUPPOSITORY RECTALLY 2 TIMES  DAILY., Disp: 12 suppository, Rfl: 0   levothyroxine (SYNTHROID) 25 MCG tablet, TAKE 1 TABLET BY MOUTH EVERY DAY BEFORE BREAKFAST, Disp: 90 tablet, Rfl: 1   LORazepam (ATIVAN) 0.5 MG tablet, TAKE 1 TABLET BY MOUTH EVERY DAY AS NEEDED FOR ANXIETY, Disp: 90 tablet, Rfl: 0   Multiple Vitamins-Minerals (CENTRUM SILVER 50+WOMEN PO), Take by mouth., Disp: , Rfl:    naproxen sodium (ALEVE) 220 MG tablet, Take 220 mg by mouth 2 (two) times daily as needed., Disp: , Rfl:    Omega-3 Fatty Acids (FISH OIL) 1200 MG CAPS, Take 1 capsule by mouth daily., Disp: , Rfl:    Polyethyl Glycol-Propyl Glycol 0.4-0.3 % SOLN, Apply 1 drop to eye 3 (three) times daily., Disp: , Rfl:    vitamin C (ASCORBIC ACID) 500 MG tablet, Take 500 mg by mouth daily., Disp: , Rfl:    vitamin E 1000 UNIT capsule, Take 1,000 Units by mouth daily. , Disp: , Rfl:    zolpidem (AMBIEN) 10 MG tablet, TAKE 1 TABLET BY MOUTH EVERY DAY AT BEDTIME AS NEEDED FOR SLEEP, Disp: 90 tablet, Rfl: 0  Review of Systems:  Constitutional: Denies fever, chills, diaphoresis, appetite change and fatigue.  HEENT: Denies photophobia, eye pain, redness, hearing loss, mouth sores, trouble swallowing, neck pain, neck stiffness and tinnitus.   Respiratory: Denies SOB, DOE,chest tightness,  and wheezing.   Cardiovascular: Denies chest pain, palpitations and leg swelling.  Gastrointestinal: Denies nausea, vomiting, abdominal pain,  diarrhea, constipation, blood in stool and abdominal distention.  Genitourinary: Denies dysuria, urgency, frequency, hematuria, flank pain and difficulty urinating.  Endocrine: Denies: hot or cold intolerance, sweats, changes in hair or nails, polyuria, polydipsia. Musculoskeletal: Denies myalgias, back pain, joint swelling, arthralgias and gait problem.  Skin: Denies pallor, rash and wound.  Neurological: Denies dizziness, seizures, syncope, weakness, light-headedness, numbness and headaches.  Hematological: Denies adenopathy. Easy  bruising, personal or family bleeding history  Psychiatric/Behavioral: Denies suicidal ideation, mood changes, confusion, nervousness, sleep disturbance and agitation    Physical Exam: Vitals:   12/31/20 1053  BP: 122/72  Pulse: 82  Temp: 97.7 F (36.5 C)  TempSrc: Oral  SpO2: 99%  Weight: 152 lb 6.4 oz (69.1 kg)  Height: 5' 1.5" (1.562 m)    Body mass index is 28.33 kg/m.   Constitutional: NAD, calm, comfortable Eyes: PERRL, lids and conjunctivae normal ENMT: Mucous membranes are moist. Posterior pharynx is erythematous but clear of any exudate or lesions.  Tympanic membrane is pearly white, no erythema or bulging. Respiratory: clear to auscultation bilaterally, no wheezing, no crackles. Normal respiratory effort. No accessory muscle use.  Cardiovascular: Regular rate and rhythm, no murmurs / rubs / gallops. No extremity edema.  Psychiatric: Normal judgment and insight. Alert and oriented x 3. Normal mood.    Impression and Plan:  Acute bronchitis, unspecified organism - Plan: azithromycin (ZITHROMAX) 250 MG tablet  Flu-like symptoms - Plan: POC COVID-19 BinaxNow, POC Influenza A/B  -In office flu A/B and point-of-care COVID test have been negative. -Given duration of symptoms I believe it is reasonable to treat her with a Z-Pak. -She knows to follow-up with [place] if symptoms fail to improve.  Time spent: 21 minutes reviewing chart, interviewing and examining patient and formulating plan of care.    [name], MD [facility] at [facility]

## 2021-01-05 ENCOUNTER — Other Ambulatory Visit: Payer: Self-pay | Admitting: Internal Medicine

## 2021-01-05 DIAGNOSIS — J209 Acute bronchitis, unspecified: Secondary | ICD-10-CM

## 2021-01-16 DIAGNOSIS — S42031A Displaced fracture of lateral end of right clavicle, initial encounter for closed fracture: Secondary | ICD-10-CM | POA: Diagnosis not present

## 2021-01-16 DIAGNOSIS — R079 Chest pain, unspecified: Secondary | ICD-10-CM | POA: Diagnosis not present

## 2021-01-16 DIAGNOSIS — W010XXA Fall on same level from slipping, tripping and stumbling without subsequent striking against object, initial encounter: Secondary | ICD-10-CM | POA: Diagnosis not present

## 2021-01-16 DIAGNOSIS — J069 Acute upper respiratory infection, unspecified: Secondary | ICD-10-CM | POA: Diagnosis not present

## 2021-01-27 ENCOUNTER — Encounter: Payer: Self-pay | Admitting: Internal Medicine

## 2021-02-02 IMAGING — DX DG CHEST 2V
2 series · 2 of 2 positions shown · non-contrast
Comparison: None.

CLINICAL DATA: Preop films for knee arthroplasty.  Former smoker.

EXAM:
CHEST - 2 VIEW

[chest pa]
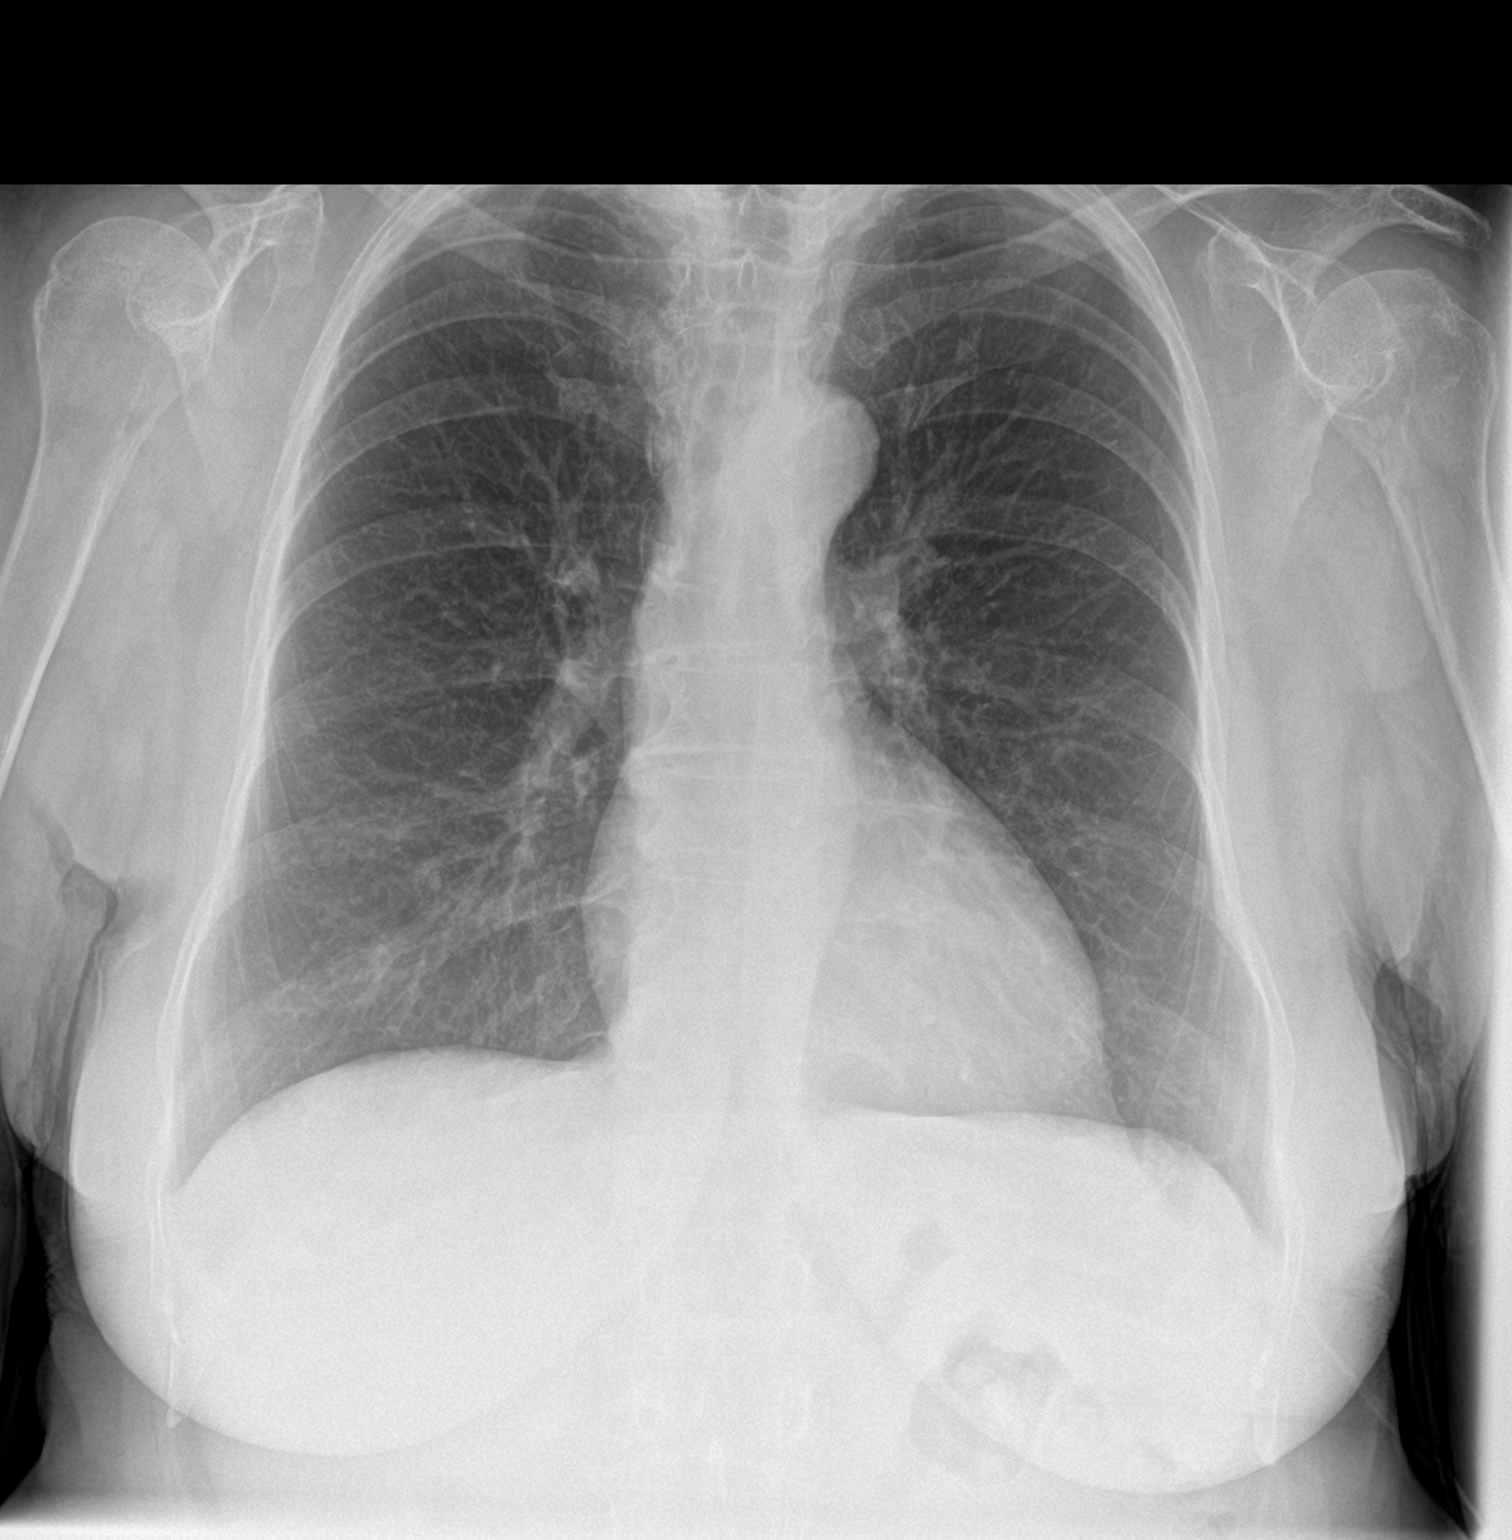

[chest lat]
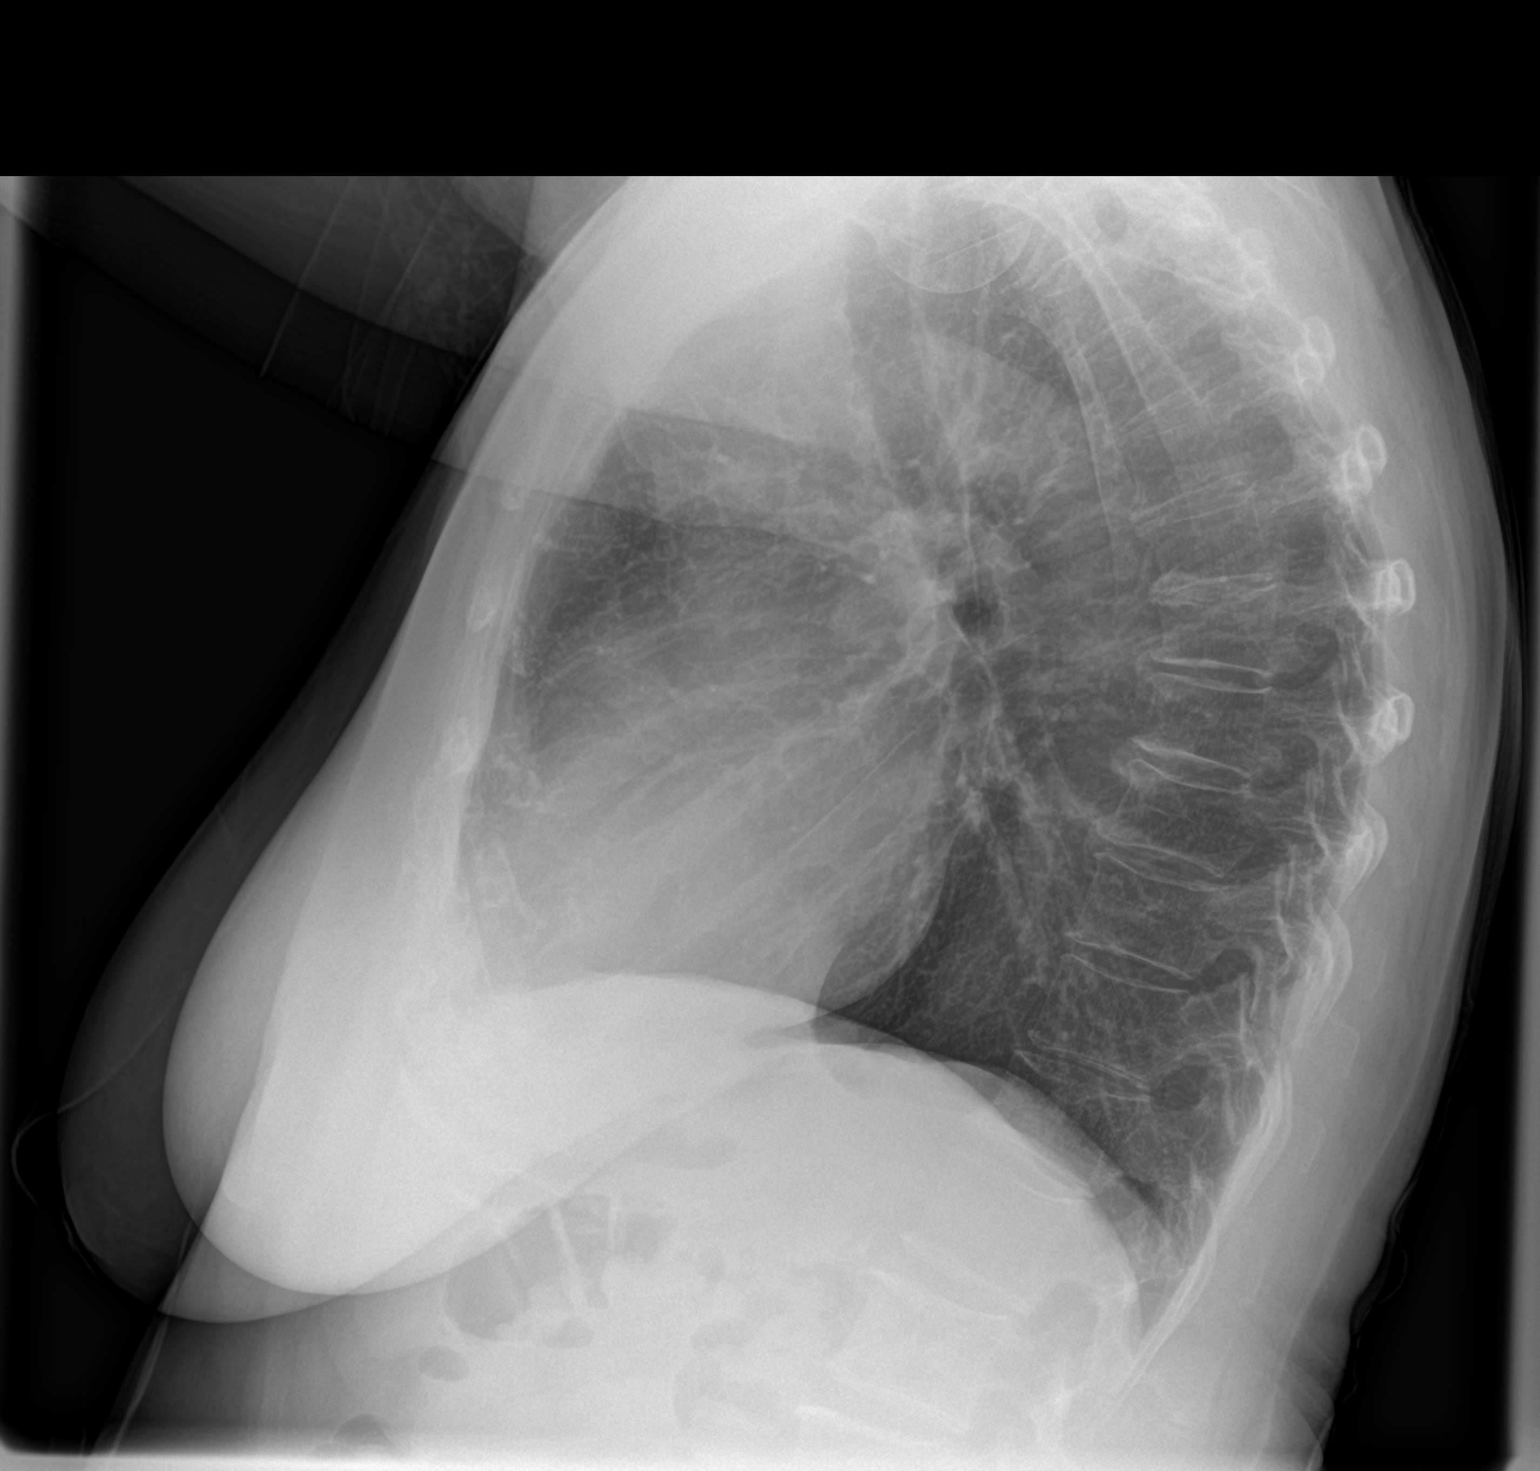

[2 of 2 positions shown; findings below may reference images not displayed]

FINDINGS: Heart and mediastinal contours are within normal limits. No focal
opacities or effusions. No acute bony abnormality.
IMPRESSION: No active cardiopulmonary disease.

## 2021-02-09 ENCOUNTER — Telehealth (INDEPENDENT_AMBULATORY_CARE_PROVIDER_SITE_OTHER): Payer: No Typology Code available for payment source | Admitting: Internal Medicine

## 2021-02-09 ENCOUNTER — Encounter: Payer: Self-pay | Admitting: Internal Medicine

## 2021-02-09 VITALS — BP 112/70 | HR 78 | Ht 61.5 in | Wt 146.0 lb

## 2021-02-09 DIAGNOSIS — F17210 Nicotine dependence, cigarettes, uncomplicated: Secondary | ICD-10-CM | POA: Diagnosis not present

## 2021-02-09 DIAGNOSIS — Z122 Encounter for screening for malignant neoplasm of respiratory organs: Secondary | ICD-10-CM

## 2021-02-09 DIAGNOSIS — G4700 Insomnia, unspecified: Secondary | ICD-10-CM | POA: Diagnosis not present

## 2021-02-09 NOTE — Progress Notes (Signed)
Virtual Visit via Telephone Note  I connected with [name] on 02/09/21 at  4:00 PM EST by telephone and verified that I am speaking with the correct person using two identifiers.   I discussed the limitations, risks, security and privacy concerns of performing an evaluation and management service by telephone and the availability of in person appointments. I also discussed with the patient that there may be a patient responsible charge related to this service. The patient expressed understanding and agreed to proceed.  Location patient: home Location provider: work office Participants present for the call: patient, provider, daughter Patient did not have a visit in the prior 7 days to address this/these issue(s).   History of Present Illness:  Patient and daughter have scheduled this phone call to discuss some acute concerns.  Daughter lives in [place].  1.  Given her chronic smoking of half pack a day for most of her life they are wanting to pursue lung cancer screening.  2.  They would like to discuss smoking cessation.  3.  The daughter wonders whether her insomnia might be part of an underlying issue such as anxiety or depression.  Patient has mention to her being more "worried" than usual.   Observations/Objective: Patient sounds cheerful and well on the phone. I do not appreciate any increased work of breathing. Speech and thought processing are grossly intact. Patient reported vitals: None reported   Current Outpatient Medications:    cetirizine (ZYRTEC ALLERGY) 10 MG tablet, Take 1 tablet (10 mg total) by mouth daily., Disp: 30 tablet, Rfl: 0   levothyroxine (SYNTHROID) 25 MCG tablet, TAKE 1 TABLET BY MOUTH EVERY DAY BEFORE BREAKFAST, Disp: 90 tablet, Rfl: 1   LORazepam (ATIVAN) 0.5 MG tablet, TAKE 1 TABLET BY MOUTH EVERY DAY AS NEEDED FOR ANXIETY, Disp: 90 tablet, Rfl: 0   Multiple Vitamins-Minerals (CENTRUM SILVER 50+WOMEN PO), Take by mouth., Disp: ,  Rfl:    naproxen sodium (ALEVE) 220 MG tablet, Take 220 mg by mouth 2 (two) times daily as needed., Disp: , Rfl:    Omega-3 Fatty Acids (FISH OIL) 1200 MG CAPS, Take 1 capsule by mouth daily., Disp: , Rfl:    Polyethyl Glycol-Propyl Glycol 0.4-0.3 % SOLN, Apply 1 drop to eye 3 (three) times daily., Disp: , Rfl:    vitamin C (ASCORBIC ACID) 500 MG tablet, Take 500 mg by mouth daily., Disp: , Rfl:    vitamin E 1000 UNIT capsule, Take 1,000 Units by mouth daily. , Disp: , Rfl:    zolpidem (AMBIEN) 10 MG tablet, TAKE 1 TABLET BY MOUTH EVERY DAY AT BEDTIME AS NEEDED FOR SLEEP, Disp: 90 tablet, Rfl: 0   hydrocortisone (ANUSOL-HC) 25 MG suppository, PLACE 1 SUPPOSITORY RECTALLY 2 TIMES DAILY., Disp: 12 suppository, Rfl: 0  Review of Systems:  Constitutional: Denies fever, chills, diaphoresis, appetite change and fatigue.  HEENT: Denies photophobia, eye pain, redness, hearing loss, ear pain,  mouth sores, trouble swallowing, neck pain, neck stiffness and tinnitus.   Respiratory: Denies SOB, DOE,  chest tightness,  and wheezing.   Cardiovascular: Denies chest pain, palpitations and leg swelling.  Gastrointestinal: Denies nausea, vomiting, abdominal pain, diarrhea, constipation, blood in stool and abdominal distention.  Genitourinary: Denies dysuria, urgency, frequency, hematuria, flank pain and difficulty urinating.  Endocrine: Denies: hot or cold intolerance, sweats, changes in hair or nails, polyuria, polydipsia. Musculoskeletal: Denies myalgias, back pain, joint swelling, arthralgias and gait problem.  Skin: Denies pallor, rash and wound.  Neurological: Denies dizziness, seizures,  syncope, weakness, light-headedness, numbness and headaches.  Hematological: Denies adenopathy. Easy bruising, personal or family bleeding history  Psychiatric/Behavioral: Denies suicidal ideation, mood changes, confusion, nervousness, sleep disturbance and agitation   Assessment and Plan:  Cigarette nicotine  dependence without complication  - Plan: CT CHEST LUNG CA SCREEN LOW DOSE W/O CM -We will discuss smoking cessation at next in-person visit.  Encounter for screening for lung cancer -Low-dose CT scan ordered.  Insomnia, unspecified type -We will discuss possibly anxiety/depression at her next in-person visit.    I discussed the assessment and treatment plan with the patient. The patient was provided an opportunity to ask questions and all were answered. The patient agreed with the plan and demonstrated an understanding of the instructions.   The patient was advised to call back or seek an in-person evaluation if the symptoms worsen or if the condition fails to improve as anticipated.  I provided 25 minutes of non-face-to-face time during this encounter.   [name], MD [facility] at [facility]

## 2021-02-12 ENCOUNTER — Ambulatory Visit (INDEPENDENT_AMBULATORY_CARE_PROVIDER_SITE_OTHER): Payer: Medicare Other | Admitting: Internal Medicine

## 2021-02-12 ENCOUNTER — Encounter: Payer: Self-pay | Admitting: Internal Medicine

## 2021-02-12 VITALS — BP 118/66 | HR 72 | Temp 98.3°F | Ht 61.5 in | Wt 152.8 lb

## 2021-02-12 DIAGNOSIS — F419 Anxiety disorder, unspecified: Secondary | ICD-10-CM

## 2021-02-12 DIAGNOSIS — G4700 Insomnia, unspecified: Secondary | ICD-10-CM

## 2021-02-12 DIAGNOSIS — F17210 Nicotine dependence, cigarettes, uncomplicated: Secondary | ICD-10-CM

## 2021-02-12 NOTE — Progress Notes (Signed)
Established Patient Office Visit     This visit occurred during the SARS-CoV-2 public health emergency.  Safety protocols were in place, including screening questions prior to the visit, additional usage of staff PPE, and extensive cleaning of exam room while observing appropriate contact time as indicated for disinfecting solutions.    CC/Reason for Visit: Follow-up lung cancer screening and anxiety  HPI: [name] is a 76 y.o. female who is coming in today for the above mentioned reasons.  She has a longstanding history of insomnia and low-grade anxiety for which she takes Ambien as needed, she is also prescribed lorazepam but uses this very infrequently.  We had a phone consultation earlier this week with her daughter.  The daughter was concerned that she might have anxiety and we scheduled her for an in person visit today.  She is here today with her husband.  He does not feel like she is an excessive worrier.  She does not feel overwhelmed with anxiety or worry.  Past Medical/Surgical History: Past Medical History:  Diagnosis Date   Anxiety    Arthritis    Knees,back,   Hypothyroidism    Osteopenia     Past Surgical History:  Procedure Laterality Date   COLONOSCOPY  2014   TOTAL KNEE ARTHROPLASTY Left 11/06/2018   Procedure: LEFT TOTAL KNEE ARTHROPLASTY;  Surgeon: Frederik Pear, MD;  Location: [facility];  Service: Orthopedics;  Laterality: Left;    Social History:  reports that she quit smoking about 9 years ago. Her smoking use included cigarettes. She has a 15.00 pack-year smoking history. She has never used smokeless tobacco. She reports current alcohol use. She reports that she does not use drugs.  Allergies: No Known Allergies  Family History:  Family History  Problem Relation Age of Onset   Heart disease Mother    Arthritis Mother    Hypertension Father    Arthritis Father      Current Outpatient Medications:    cetirizine (ZYRTEC ALLERGY) 10 MG  tablet, Take 1 tablet (10 mg total) by mouth daily., Disp: 30 tablet, Rfl: 0   levothyroxine (SYNTHROID) 25 MCG tablet, TAKE 1 TABLET BY MOUTH EVERY DAY BEFORE BREAKFAST, Disp: 90 tablet, Rfl: 1   LORazepam (ATIVAN) 0.5 MG tablet, TAKE 1 TABLET BY MOUTH EVERY DAY AS NEEDED FOR ANXIETY, Disp: 90 tablet, Rfl: 0   Multiple Vitamins-Minerals (CENTRUM SILVER 50+WOMEN PO), Take by mouth., Disp: , Rfl:    naproxen sodium (ALEVE) 220 MG tablet, Take 220 mg by mouth 2 (two) times daily as needed., Disp: , Rfl:    Omega-3 Fatty Acids (FISH OIL) 1200 MG CAPS, Take 1 capsule by mouth daily., Disp: , Rfl:    Polyethyl Glycol-Propyl Glycol 0.4-0.3 % SOLN, Apply 1 drop to eye 3 (three) times daily., Disp: , Rfl:    vitamin C (ASCORBIC ACID) 500 MG tablet, Take 500 mg by mouth daily., Disp: , Rfl:    vitamin E 1000 UNIT capsule, Take 1,000 Units by mouth daily. , Disp: , Rfl:    zolpidem (AMBIEN) 10 MG tablet, TAKE 1 TABLET BY MOUTH EVERY DAY AT BEDTIME AS NEEDED FOR SLEEP, Disp: 90 tablet, Rfl: 0   hydrocortisone (ANUSOL-HC) 25 MG suppository, PLACE 1 SUPPOSITORY RECTALLY 2 TIMES DAILY., Disp: 12 suppository, Rfl: 0  Review of Systems:  Constitutional: Denies fever, chills, diaphoresis, appetite change and fatigue.  HEENT: Denies photophobia, eye pain, redness, hearing loss, ear pain, congestion, sore throat, rhinorrhea, sneezing, mouth sores, trouble  swallowing, neck pain, neck stiffness and tinnitus.   Respiratory: Denies SOB, DOE, cough, chest tightness,  and wheezing.   Cardiovascular: Denies chest pain, palpitations and leg swelling.  Gastrointestinal: Denies nausea, vomiting, abdominal pain, diarrhea, constipation, blood in stool and abdominal distention.  Genitourinary: Denies dysuria, urgency, frequency, hematuria, flank pain and difficulty urinating.  Endocrine: Denies: hot or cold intolerance, sweats, changes in hair or nails, polyuria, polydipsia. Musculoskeletal: Denies myalgias, back pain, joint  swelling, arthralgias and gait problem.  Skin: Denies pallor, rash and wound.  Neurological: Denies dizziness, seizures, syncope, weakness, light-headedness, numbness and headaches.  Hematological: Denies adenopathy. Easy bruising, personal or family bleeding history  Psychiatric/Behavioral: Denies suicidal ideation, mood changes, confusion, nervousness and agitation    Physical Exam: Vitals:   02/12/21 1503  BP: 118/66  Pulse: 72  Temp: 98.3 F (36.8 C)  TempSrc: Oral  SpO2: 99%  Weight: 152 lb 12.8 oz (69.3 kg)  Height: 5' 1.5" (1.562 m)    Body mass index is 28.4 kg/m.   Constitutional: NAD, calm, comfortable Eyes: PERRL, lids and conjunctivae normal ENMT: Mucous membranes are moist.  Neurologic: Grossly intact and nonfocal Psychiatric: Normal judgment and insight. Alert and oriented x 3. Normal mood.    Impression and Plan:  Anxiety Insomnia, unspecified type  [place] Office Visit from 02/12/2021 in [place] at [place]  PHQ-9 Total Score 1      -She definitely has longstanding insomnia, she does not appear depressed or anxious either on exam today or  on completion of her PHQ-9 and GAD-7 questionnaires. -Have given her information on how to schedule CBT, I do not feel like medication is necessary as prescribed. -Can continue to use Ambien sporadically as she does for insomnia.  Cigarette nicotine dependence without complication  - Plan: Ambulatory Referral Lung Cancer Screening [facility] -She is not interested in discussing smoking cessation at this time, will continue to address at subsequent visits.     [name], MD [facility] at [facility]

## 2021-02-17 DIAGNOSIS — L814 Other melanin hyperpigmentation: Secondary | ICD-10-CM | POA: Diagnosis not present

## 2021-02-17 DIAGNOSIS — L570 Actinic keratosis: Secondary | ICD-10-CM | POA: Diagnosis not present

## 2021-02-17 DIAGNOSIS — L853 Xerosis cutis: Secondary | ICD-10-CM | POA: Diagnosis not present

## 2021-02-17 DIAGNOSIS — L821 Other seborrheic keratosis: Secondary | ICD-10-CM | POA: Diagnosis not present

## 2021-02-17 DIAGNOSIS — L700 Acne vulgaris: Secondary | ICD-10-CM | POA: Diagnosis not present

## 2021-02-17 DIAGNOSIS — L720 Epidermal cyst: Secondary | ICD-10-CM | POA: Diagnosis not present

## 2021-02-17 DIAGNOSIS — D225 Melanocytic nevi of trunk: Secondary | ICD-10-CM | POA: Diagnosis not present

## 2021-02-18 ENCOUNTER — Encounter: Payer: Self-pay | Admitting: Internal Medicine

## 2021-02-18 DIAGNOSIS — Z122 Encounter for screening for malignant neoplasm of respiratory organs: Secondary | ICD-10-CM

## 2021-02-18 NOTE — Telephone Encounter (Signed)
Noted  

## 2021-02-18 NOTE — Telephone Encounter (Signed)
[name] called back and stated their office cannot perform scans for patients with Medicare A and B.  Test would need to be performed at [facility], [place] or [place].  Message sent to PCP.

## 2021-02-18 NOTE — Telephone Encounter (Signed)
Spoke with Taren at [place] [phone]) and she stated they will contact the patient for an appt.  Message sent to PCP.

## 2021-02-24 ENCOUNTER — Other Ambulatory Visit: Payer: Self-pay | Admitting: Internal Medicine

## 2021-02-24 DIAGNOSIS — G4700 Insomnia, unspecified: Secondary | ICD-10-CM

## 2021-02-26 ENCOUNTER — Other Ambulatory Visit: Payer: Self-pay | Admitting: Internal Medicine

## 2021-02-26 DIAGNOSIS — M25511 Pain in right shoulder: Secondary | ICD-10-CM | POA: Diagnosis not present

## 2021-02-26 DIAGNOSIS — G4700 Insomnia, unspecified: Secondary | ICD-10-CM

## 2021-03-03 ENCOUNTER — Encounter: Payer: Self-pay | Admitting: Internal Medicine

## 2021-03-05 NOTE — Telephone Encounter (Signed)
Appointment scheduled for 03/25/21

## 2021-03-18 DIAGNOSIS — Z1231 Encounter for screening mammogram for malignant neoplasm of breast: Secondary | ICD-10-CM | POA: Diagnosis not present

## 2021-03-18 DIAGNOSIS — M8588 Other specified disorders of bone density and structure, other site: Secondary | ICD-10-CM | POA: Diagnosis not present

## 2021-03-18 DIAGNOSIS — M810 Age-related osteoporosis without current pathological fracture: Secondary | ICD-10-CM | POA: Diagnosis not present

## 2021-03-18 DIAGNOSIS — Z780 Asymptomatic menopausal state: Secondary | ICD-10-CM | POA: Diagnosis not present

## 2021-03-18 LAB — HM MAMMOGRAPHY

## 2021-03-18 LAB — HM DEXA SCAN

## 2021-03-23 ENCOUNTER — Encounter: Payer: Self-pay | Admitting: Internal Medicine

## 2021-03-25 ENCOUNTER — Ambulatory Visit
Admission: RE | Admit: 2021-03-25 | Discharge: 2021-03-25 | Disposition: A | Discharge status: Home / Self Care | Payer: No Typology Code available for payment source | Source: Ambulatory Visit | Attending: Internal Medicine | Admitting: Internal Medicine

## 2021-03-25 ENCOUNTER — Other Ambulatory Visit: Payer: Self-pay

## 2021-03-25 DIAGNOSIS — Z122 Encounter for screening for malignant neoplasm of respiratory organs: Secondary | ICD-10-CM

## 2021-03-25 DIAGNOSIS — F17210 Nicotine dependence, cigarettes, uncomplicated: Secondary | ICD-10-CM | POA: Diagnosis not present

## 2021-04-09 ENCOUNTER — Encounter: Payer: Self-pay | Admitting: Internal Medicine

## 2021-05-20 ENCOUNTER — Other Ambulatory Visit: Payer: Self-pay | Admitting: Internal Medicine

## 2021-06-16 DIAGNOSIS — Z23 Encounter for immunization: Secondary | ICD-10-CM | POA: Diagnosis not present

## 2021-06-23 DIAGNOSIS — L814 Other melanin hyperpigmentation: Secondary | ICD-10-CM | POA: Diagnosis not present

## 2021-06-23 DIAGNOSIS — L821 Other seborrheic keratosis: Secondary | ICD-10-CM | POA: Diagnosis not present

## 2021-06-23 DIAGNOSIS — D492 Neoplasm of unspecified behavior of bone, soft tissue, and skin: Secondary | ICD-10-CM | POA: Diagnosis not present

## 2021-06-23 DIAGNOSIS — D225 Melanocytic nevi of trunk: Secondary | ICD-10-CM | POA: Diagnosis not present

## 2021-07-16 ENCOUNTER — Other Ambulatory Visit: Payer: Self-pay | Admitting: Internal Medicine

## 2021-07-16 DIAGNOSIS — G4700 Insomnia, unspecified: Secondary | ICD-10-CM

## 2021-07-16 NOTE — Telephone Encounter (Signed)
Last Rx given for Lorazepam-2/14-#90 with no ref; Zolpidem-2/16 #30  with no ref

## 2021-08-13 DIAGNOSIS — H40023 Open angle with borderline findings, high risk, bilateral: Secondary | ICD-10-CM | POA: Diagnosis not present

## 2021-08-13 DIAGNOSIS — Z961 Presence of intraocular lens: Secondary | ICD-10-CM | POA: Diagnosis not present

## 2021-08-13 DIAGNOSIS — H04123 Dry eye syndrome of bilateral lacrimal glands: Secondary | ICD-10-CM | POA: Diagnosis not present

## 2021-08-13 DIAGNOSIS — H1045 Other chronic allergic conjunctivitis: Secondary | ICD-10-CM | POA: Diagnosis not present

## 2021-08-13 DIAGNOSIS — H0102A Squamous blepharitis right eye, upper and lower eyelids: Secondary | ICD-10-CM | POA: Diagnosis not present

## 2021-08-14 ENCOUNTER — Other Ambulatory Visit: Payer: Self-pay | Admitting: Internal Medicine

## 2021-09-29 ENCOUNTER — Encounter: Payer: Self-pay | Admitting: Internal Medicine

## 2021-09-29 ENCOUNTER — Ambulatory Visit (INDEPENDENT_AMBULATORY_CARE_PROVIDER_SITE_OTHER): Payer: Medicare Other | Admitting: Internal Medicine

## 2021-09-29 ENCOUNTER — Telehealth: Payer: Self-pay | Admitting: Internal Medicine

## 2021-09-29 VITALS — BP 130/70 | HR 77 | Temp 98.0°F | Ht 61.5 in | Wt 140.1 lb

## 2021-09-29 DIAGNOSIS — Z23 Encounter for immunization: Secondary | ICD-10-CM

## 2021-09-29 DIAGNOSIS — E039 Hypothyroidism, unspecified: Secondary | ICD-10-CM | POA: Diagnosis not present

## 2021-09-29 DIAGNOSIS — M8589 Other specified disorders of bone density and structure, multiple sites: Secondary | ICD-10-CM | POA: Diagnosis not present

## 2021-09-29 DIAGNOSIS — E785 Hyperlipidemia, unspecified: Secondary | ICD-10-CM

## 2021-09-29 DIAGNOSIS — Z0000 Encounter for general adult medical examination without abnormal findings: Secondary | ICD-10-CM

## 2021-09-29 DIAGNOSIS — M8580 Other specified disorders of bone density and structure, unspecified site: Secondary | ICD-10-CM

## 2021-09-29 LAB — VITAMIN D 25 HYDROXY (VIT D DEFICIENCY, FRACTURES): VITD: 54.11 ng/mL (ref 30.00–100.00)

## 2021-09-29 LAB — CBC WITH DIFFERENTIAL/PLATELET
Basophils Absolute: 0.1 10*3/uL (ref 0.0–0.1)
Basophils Relative: 1.1 % (ref 0.0–3.0)
Eosinophils Absolute: 0.1 10*3/uL (ref 0.0–0.7)
Eosinophils Relative: 2.1 % (ref 0.0–5.0)
HCT: 41.6 % (ref 36.0–46.0)
Hemoglobin: 14.1 g/dL (ref 12.0–15.0)
Lymphocytes Relative: 37.5 % (ref 12.0–46.0)
Lymphs Abs: 2 10*3/uL (ref 0.7–4.0)
MCHC: 33.9 g/dL (ref 30.0–36.0)
MCV: 99.7 fl (ref 78.0–100.0)
Monocytes Absolute: 0.5 10*3/uL (ref 0.1–1.0)
Monocytes Relative: 8.9 % (ref 3.0–12.0)
Neutro Abs: 2.7 10*3/uL (ref 1.4–7.7)
Neutrophils Relative %: 50.4 % (ref 43.0–77.0)
Platelets: 306 10*3/uL (ref 150.0–400.0)
RBC: 4.17 Mil/uL (ref 3.87–5.11)
RDW: 12.6 % (ref 11.5–15.5)
WBC: 5.3 10*3/uL (ref 4.0–10.5)

## 2021-09-29 LAB — COMPREHENSIVE METABOLIC PANEL
ALT: 14 U/L (ref 0–35)
AST: 19 U/L (ref 0–37)
Albumin: 4.2 g/dL (ref 3.5–5.2)
Alkaline Phosphatase: 73 U/L (ref 39–117)
BUN: 17 mg/dL (ref 6–23)
CO2: 24 mEq/L (ref 19–32)
Calcium: 9.3 mg/dL (ref 8.4–10.5)
Chloride: 101 mEq/L (ref 96–112)
Creatinine, Ser: 0.76 mg/dL (ref 0.40–1.20)
GFR: 76.78 mL/min (ref >60.00)
Glucose, Bld: 98 mg/dL (ref 70–99)
Potassium: 4.3 mEq/L (ref 3.5–5.1)
Sodium: 136 mEq/L (ref 135–145)
Total Bilirubin: 0.7 mg/dL (ref 0.2–1.2)
Total Protein: 7.1 g/dL (ref 6.0–8.3)

## 2021-09-29 LAB — LIPID PANEL
Cholesterol: 207 mg/dL — ABNORMAL HIGH (ref 0–200)
HDL: 71.9 mg/dL (ref >39.00)
LDL Cholesterol: 119 mg/dL — ABNORMAL HIGH (ref 0–99)
NonHDL: 134.76
Total CHOL/HDL Ratio: 3
Triglycerides: 79 mg/dL (ref 0.0–149.0)
VLDL: 15.8 mg/dL (ref 0.0–40.0)

## 2021-09-29 LAB — VITAMIN B12: Vitamin B-12: 459 pg/mL (ref 211–911)

## 2021-09-29 LAB — TSH: TSH: 2.97 u[IU]/mL (ref 0.35–5.50)

## 2021-09-29 LAB — HEMOGLOBIN A1C: Hgb A1c MFr Bld: 5.1 % (ref 4.6–6.5)

## 2021-09-29 NOTE — Progress Notes (Addendum)
Established Patient Office Visit     CC/Reason for Visit: Annual preventive exam and subsequent Medicare wellness visit  HPI: [name] is a 76 y.o. female who is coming in today for the above mentioned reasons. Past Medical History is significant for: Hypothyroidism, osteopenia, ongoing smoking use.  She has routine eye and dental care.  She is due for her flu to vaccine.  She has no acute concerns or complaints today.   Past Medical/Surgical History: Past Medical History:  Diagnosis Date   Anxiety    Arthritis    Knees,back,   Hypothyroidism    Osteopenia     Past Surgical History:  Procedure Laterality Date   COLONOSCOPY  2014   TOTAL KNEE ARTHROPLASTY Left 11/06/2018   Procedure: LEFT TOTAL KNEE ARTHROPLASTY;  Surgeon: Frederik Pear, MD;  Location: [facility];  Service: Orthopedics;  Laterality: Left;    Social History:  reports that she quit smoking about 10 years ago. Her smoking use included cigarettes. She has a 15.00 pack-year smoking history. She has never used smokeless tobacco. She reports current alcohol use. She reports that she does not use drugs.  Allergies: No Known Allergies  Family History:  Family History  Problem Relation Age of Onset   Heart disease Mother    Arthritis Mother    Hypertension Father    Arthritis Father      Current Outpatient Medications:    cetirizine (ZYRTEC ALLERGY) 10 MG tablet, Take 1 tablet (10 mg total) by mouth daily., Disp: 30 tablet, Rfl: 0   levothyroxine (SYNTHROID) 25 MCG tablet, TAKE 1 TABLET BY MOUTH EVERY DAY BEFORE BREAKFAST, Disp: 90 tablet, Rfl: 0   LORazepam (ATIVAN) 0.5 MG tablet, TAKE 1 TABLET BY MOUTH EVERY DAY AS NEEDED FOR ANXIETY, Disp: 90 tablet, Rfl: 0   Multiple Vitamins-Minerals (CENTRUM SILVER 50+WOMEN PO), Take by mouth., Disp: , Rfl:    Omega-3 Fatty Acids (FISH OIL) 1200 MG CAPS, Take 1 capsule by mouth daily., Disp: , Rfl:    Polyethyl Glycol-Propyl Glycol 0.4-0.3 % SOLN, Apply 1 drop  to eye 3 (three) times daily., Disp: , Rfl:    vitamin C (ASCORBIC ACID) 500 MG tablet, Take 500 mg by mouth daily., Disp: , Rfl:    vitamin E 1000 UNIT capsule, Take 1,000 Units by mouth daily. , Disp: , Rfl:    zolpidem (AMBIEN) 10 MG tablet, TAKE 1 TABLET BY MOUTH AT BEDTIME AS NEEDED FOR SLEEP *INS MAX OF 15 IN 25 DAYS*, Disp: 15 tablet, Rfl: 1   hydrocortisone (ANUSOL-HC) 25 MG suppository, PLACE 1 SUPPOSITORY RECTALLY 2 TIMES DAILY., Disp: 12 suppository, Rfl: 0   naproxen sodium (ALEVE) 220 MG tablet, Take 220 mg by mouth 2 (two) times daily as needed. (Patient not taking: Reported on 09/29/2021), Disp: , Rfl:   Review of Systems:  Constitutional: Denies fever, chills, diaphoresis, appetite change and fatigue.  HEENT: Denies photophobia, eye pain, redness, hearing loss, ear pain, congestion, sore throat, rhinorrhea, sneezing, mouth sores, trouble swallowing, neck pain, neck stiffness and tinnitus.   Respiratory: Denies SOB, DOE, cough, chest tightness,  and wheezing.   Cardiovascular: Denies chest pain, palpitations and leg swelling.  Gastrointestinal: Denies nausea, vomiting, abdominal pain, diarrhea, constipation, blood in stool and abdominal distention.  Genitourinary: Denies dysuria, urgency, frequency, hematuria, flank pain and difficulty urinating.  Endocrine: Denies: hot or cold intolerance, sweats, changes in hair or nails, polyuria, polydipsia. Musculoskeletal: Denies myalgias, back pain, joint swelling, arthralgias and gait problem.  Skin:  Denies pallor, rash and wound.  Neurological: Denies dizziness, seizures, syncope, weakness, light-headedness, numbness and headaches.  Hematological: Denies adenopathy. Easy bruising, personal or family bleeding history  Psychiatric/Behavioral: Denies suicidal ideation, mood changes, confusion, nervousness, sleep disturbance and agitation    Physical Exam: Vitals:   09/29/21 1029  BP: 130/70  Pulse: 77  Temp: 98 F (36.7 C)  SpO2: 99%   Weight: 140 lb 1.6 oz (63.5 kg)  Height: 5' 1.5" (1.562 m)    Body mass index is 26.04 kg/m.   Constitutional: NAD, calm, comfortable Eyes: PERRL, lids and conjunctivae normal ENMT: Mucous membranes are moist. Posterior pharynx clear of any exudate or lesions. Normal dentition. Tympanic membrane is pearly white, no erythema or bulging. Neck: normal, supple, no masses, no thyromegaly Respiratory: clear to auscultation bilaterally, no wheezing, no crackles. Normal respiratory effort. No accessory muscle use.  Cardiovascular: Regular rate and rhythm, no murmurs / rubs / gallops. No extremity edema. 2+ pedal pulses. No carotid bruits.  Abdomen: no tenderness, no masses palpated. No hepatosplenomegaly. Bowel sounds positive.  Musculoskeletal: no clubbing / cyanosis. No joint deformity upper and lower extremities. Good ROM, no contractures. Normal muscle tone.  Skin: no rashes, lesions, ulcers. No induration Neurologic: CN 2-12 grossly intact. Sensation intact, DTR normal. Strength 5/5 in all 4.  Psychiatric: Normal judgment and insight. Alert and oriented x 3. Normal mood.    Subsequent Medicare wellness visit   1. Risk factors, based on past  M,S,F -cardiovascular disease risk factors include age, known hyperlipidemia   2.  Physical activities: Walks around 45 minutes on a daily basis   3.  Depression/mood: History of depression mood is stable   4.  Hearing: Chronically decreased hearing of left ear   5.  ADL's: Independent in all ADLs   6.  Fall risk: Low fall risk   7.  Home safety: No problems identified   8.  Height weight, and visual acuity: height and weight as above, vision:  Vision Screening   Right eye Left eye Both eyes  Without correction '20/20 20/20 20/20 '$  With correction        9.  Counseling: Advised she update vaccination status   10. Lab orders based on risk factors: Laboratory update will be reviewed   11. Referral : None today   12. Care plan:  Follow-up with me in 6 months   13. Cognitive assessment: No cognitive impairment   14. Screening: Patient provided with a written and personalized 5-10 year screening schedule in the AVS. yes    15. Provider List Update: PCP only  16. Advance Directives: Full code   17. Opioids: Patient is not on any opioid prescriptions and has no risk factors for a substance use disorder.   [place] Office Visit from 02/12/2021 in [place] [facility] [place]  PHQ-9 Total Score 1          01/18/2020    4:56 PM 09/11/2020    4:23 PM 12/31/2020   11:25 AM 02/12/2021    3:06 PM 09/29/2021   10:32 AM  Fall Risk  Falls in the past year?  0 0 1 1  Was there an injury with Fall?  0 0 1 0  Fall Risk Category Calculator  0 0 2 1  Fall Risk Category  Low Low Moderate Low  Patient Fall Risk Level Low fall risk  Moderate fall risk  Low fall risk  Patient at Risk for Falls Due to     No Fall  Risks  Fall risk Follow up   Falls evaluation completed  Falls evaluation completed     Impression and Plan:  Encounter for preventive health examination - Plan: Hemoglobin A1c, Vitamin B12, VITAMIN D 25 Hydroxy (Vit-D Deficiency, Fractures), VITAMIN D 25 Hydroxy (Vit-D Deficiency, Fractures), Vitamin B12, Hemoglobin A1c  Need for immunization against influenza - Plan: Flu Vaccine QUAD High Dose(Fluad)  Hyperlipidemia, unspecified hyperlipidemia type - Plan: CBC with Differential/Platelet, Comprehensive metabolic panel, Lipid panel, Lipid panel, Comprehensive metabolic panel, CBC with Differential/Platelet  Acquired hypothyroidism - Plan: TSH, TSH  Osteopenia, unspecified location    -Recommend routine eye and dental care. -Immunizations: Flu vaccine administered today, all other immunizations are up-to-date and age-appropriate -Healthy lifestyle discussed in detail. -Labs to be updated today. -Colon cancer screening: 11/2012 -Breast cancer screening: 03/2021 -Cervical cancer screening: No further  due to age -Lung cancer screening: 03/2021 -Prostate cancer screening: Not applicable -DEXA: 07/1217    [name], MD [place] [facility]

## 2021-09-29 NOTE — Telephone Encounter (Signed)
Patient wants a copy of her lab results from today mailed to her.

## 2021-10-07 ENCOUNTER — Encounter: Payer: Self-pay | Admitting: Internal Medicine

## 2021-10-27 ENCOUNTER — Other Ambulatory Visit: Payer: Self-pay | Admitting: Internal Medicine

## 2021-10-27 DIAGNOSIS — G4700 Insomnia, unspecified: Secondary | ICD-10-CM

## 2021-12-15 ENCOUNTER — Other Ambulatory Visit: Payer: Self-pay | Admitting: Internal Medicine

## 2021-12-15 DIAGNOSIS — G4700 Insomnia, unspecified: Secondary | ICD-10-CM

## 2022-02-15 ENCOUNTER — Other Ambulatory Visit: Payer: Self-pay | Admitting: Internal Medicine

## 2022-02-15 DIAGNOSIS — G4700 Insomnia, unspecified: Secondary | ICD-10-CM

## 2022-02-17 DIAGNOSIS — L821 Other seborrheic keratosis: Secondary | ICD-10-CM | POA: Diagnosis not present

## 2022-02-17 DIAGNOSIS — L814 Other melanin hyperpigmentation: Secondary | ICD-10-CM | POA: Diagnosis not present

## 2022-02-17 DIAGNOSIS — D225 Melanocytic nevi of trunk: Secondary | ICD-10-CM | POA: Diagnosis not present

## 2022-02-17 DIAGNOSIS — C44722 Squamous cell carcinoma of skin of right lower limb, including hip: Secondary | ICD-10-CM | POA: Diagnosis not present

## 2022-02-17 DIAGNOSIS — L570 Actinic keratosis: Secondary | ICD-10-CM | POA: Diagnosis not present

## 2022-02-17 DIAGNOSIS — D492 Neoplasm of unspecified behavior of bone, soft tissue, and skin: Secondary | ICD-10-CM | POA: Diagnosis not present

## 2022-03-02 DIAGNOSIS — I872 Venous insufficiency (chronic) (peripheral): Secondary | ICD-10-CM | POA: Diagnosis not present

## 2022-03-02 DIAGNOSIS — C44729 Squamous cell carcinoma of skin of left lower limb, including hip: Secondary | ICD-10-CM | POA: Diagnosis not present

## 2022-03-09 DIAGNOSIS — Z48817 Encounter for surgical aftercare following surgery on the skin and subcutaneous tissue: Secondary | ICD-10-CM | POA: Diagnosis not present

## 2022-03-09 DIAGNOSIS — Z4801 Encounter for change or removal of surgical wound dressing: Secondary | ICD-10-CM | POA: Diagnosis not present

## 2022-03-16 DIAGNOSIS — Z48817 Encounter for surgical aftercare following surgery on the skin and subcutaneous tissue: Secondary | ICD-10-CM | POA: Diagnosis not present

## 2022-03-23 DIAGNOSIS — Z48817 Encounter for surgical aftercare following surgery on the skin and subcutaneous tissue: Secondary | ICD-10-CM | POA: Diagnosis not present

## 2022-03-24 DIAGNOSIS — Z1231 Encounter for screening mammogram for malignant neoplasm of breast: Secondary | ICD-10-CM | POA: Diagnosis not present

## 2022-03-26 ENCOUNTER — Encounter: Payer: Self-pay | Admitting: Internal Medicine

## 2022-03-31 DIAGNOSIS — R928 Other abnormal and inconclusive findings on diagnostic imaging of breast: Secondary | ICD-10-CM | POA: Diagnosis not present

## 2022-03-31 LAB — HM MAMMOGRAPHY

## 2022-04-19 ENCOUNTER — Other Ambulatory Visit: Payer: Self-pay | Admitting: Internal Medicine

## 2022-04-19 DIAGNOSIS — G4700 Insomnia, unspecified: Secondary | ICD-10-CM

## 2022-05-03 ENCOUNTER — Other Ambulatory Visit: Payer: Self-pay | Admitting: Internal Medicine

## 2022-05-03 DIAGNOSIS — Z48817 Encounter for surgical aftercare following surgery on the skin and subcutaneous tissue: Secondary | ICD-10-CM | POA: Diagnosis not present

## 2022-05-18 DIAGNOSIS — Z48817 Encounter for surgical aftercare following surgery on the skin and subcutaneous tissue: Secondary | ICD-10-CM | POA: Diagnosis not present

## 2022-06-22 ENCOUNTER — Other Ambulatory Visit: Payer: Self-pay | Admitting: Internal Medicine

## 2022-06-22 DIAGNOSIS — G4700 Insomnia, unspecified: Secondary | ICD-10-CM

## 2022-08-18 DIAGNOSIS — Z961 Presence of intraocular lens: Secondary | ICD-10-CM | POA: Diagnosis not present

## 2022-08-18 DIAGNOSIS — L821 Other seborrheic keratosis: Secondary | ICD-10-CM | POA: Diagnosis not present

## 2022-08-18 DIAGNOSIS — L570 Actinic keratosis: Secondary | ICD-10-CM | POA: Diagnosis not present

## 2022-08-18 DIAGNOSIS — H40023 Open angle with borderline findings, high risk, bilateral: Secondary | ICD-10-CM | POA: Diagnosis not present

## 2022-08-18 DIAGNOSIS — L239 Allergic contact dermatitis, unspecified cause: Secondary | ICD-10-CM | POA: Diagnosis not present

## 2022-08-18 DIAGNOSIS — L814 Other melanin hyperpigmentation: Secondary | ICD-10-CM | POA: Diagnosis not present

## 2022-08-18 DIAGNOSIS — D225 Melanocytic nevi of trunk: Secondary | ICD-10-CM | POA: Diagnosis not present

## 2022-08-26 ENCOUNTER — Encounter (INDEPENDENT_AMBULATORY_CARE_PROVIDER_SITE_OTHER): Payer: Self-pay

## 2022-09-15 ENCOUNTER — Other Ambulatory Visit: Payer: Self-pay | Admitting: Internal Medicine

## 2022-09-15 DIAGNOSIS — G4700 Insomnia, unspecified: Secondary | ICD-10-CM

## 2022-09-27 DIAGNOSIS — Z23 Encounter for immunization: Secondary | ICD-10-CM | POA: Diagnosis not present

## 2022-10-08 DIAGNOSIS — Z23 Encounter for immunization: Secondary | ICD-10-CM | POA: Diagnosis not present

## 2022-10-11 ENCOUNTER — Encounter: Payer: Medicare Other | Admitting: Internal Medicine

## 2022-10-11 DIAGNOSIS — Z0000 Encounter for general adult medical examination without abnormal findings: Secondary | ICD-10-CM

## 2022-10-11 DIAGNOSIS — E785 Hyperlipidemia, unspecified: Secondary | ICD-10-CM

## 2022-10-14 ENCOUNTER — Encounter: Payer: Self-pay | Admitting: Internal Medicine

## 2022-10-14 NOTE — Progress Notes (Signed)
Medicare Wellness question were answered 10/14/22.

## 2022-10-18 ENCOUNTER — Encounter: Payer: Self-pay | Admitting: Internal Medicine

## 2022-10-18 ENCOUNTER — Ambulatory Visit (INDEPENDENT_AMBULATORY_CARE_PROVIDER_SITE_OTHER): Payer: No Typology Code available for payment source | Admitting: Internal Medicine

## 2022-10-18 VITALS — BP 128/70 | HR 70 | Temp 98.2°F | Ht 61.0 in | Wt 150.0 lb

## 2022-10-18 DIAGNOSIS — M8580 Other specified disorders of bone density and structure, unspecified site: Secondary | ICD-10-CM | POA: Diagnosis not present

## 2022-10-18 DIAGNOSIS — Z0000 Encounter for general adult medical examination without abnormal findings: Secondary | ICD-10-CM | POA: Diagnosis not present

## 2022-10-18 DIAGNOSIS — E039 Hypothyroidism, unspecified: Secondary | ICD-10-CM | POA: Diagnosis not present

## 2022-10-18 DIAGNOSIS — E785 Hyperlipidemia, unspecified: Secondary | ICD-10-CM | POA: Diagnosis not present

## 2022-10-18 DIAGNOSIS — F17210 Nicotine dependence, cigarettes, uncomplicated: Secondary | ICD-10-CM | POA: Diagnosis not present

## 2022-10-18 LAB — CBC WITH DIFFERENTIAL/PLATELET
Basophils Absolute: 0.1 10*3/uL (ref 0.0–0.1)
Basophils Relative: 1 % (ref 0.0–3.0)
Eosinophils Absolute: 0.2 10*3/uL (ref 0.0–0.7)
Eosinophils Relative: 2.7 % (ref 0.0–5.0)
HCT: 40.8 % (ref 36.0–46.0)
Hemoglobin: 13.4 g/dL (ref 12.0–15.0)
Lymphocytes Relative: 40.9 % (ref 12.0–46.0)
Lymphs Abs: 2.3 10*3/uL (ref 0.7–4.0)
MCHC: 33 g/dL (ref 30.0–36.0)
MCV: 99.4 fL (ref 78.0–100.0)
Monocytes Absolute: 0.4 10*3/uL (ref 0.1–1.0)
Monocytes Relative: 6.8 % (ref 3.0–12.0)
Neutro Abs: 2.7 10*3/uL (ref 1.4–7.7)
Neutrophils Relative %: 48.6 % (ref 43.0–77.0)
Platelets: 366 10*3/uL (ref 150.0–400.0)
RBC: 4.1 Mil/uL (ref 3.87–5.11)
RDW: 12.5 % (ref 11.5–15.5)
WBC: 5.6 10*3/uL (ref 4.0–10.5)

## 2022-10-18 LAB — COMPREHENSIVE METABOLIC PANEL
ALT: 12 U/L (ref 0–35)
AST: 17 U/L (ref 0–37)
Albumin: 4.2 g/dL (ref 3.5–5.2)
Alkaline Phosphatase: 67 U/L (ref 39–117)
BUN: 18 mg/dL (ref 6–23)
CO2: 26 meq/L (ref 19–32)
Calcium: 9.3 mg/dL (ref 8.4–10.5)
Chloride: 102 meq/L (ref 96–112)
Creatinine, Ser: 0.74 mg/dL (ref 0.40–1.20)
GFR: 78.69 mL/min (ref >60.00)
Glucose, Bld: 85 mg/dL (ref 70–99)
Potassium: 4 meq/L (ref 3.5–5.1)
Sodium: 138 meq/L (ref 135–145)
Total Bilirubin: 0.5 mg/dL (ref 0.2–1.2)
Total Protein: 6.9 g/dL (ref 6.0–8.3)

## 2022-10-18 LAB — LIPID PANEL
Cholesterol: 195 mg/dL (ref 0–200)
HDL: 67 mg/dL (ref >39.00)
LDL Cholesterol: 100 mg/dL — ABNORMAL HIGH (ref 0–99)
NonHDL: 128.42
Total CHOL/HDL Ratio: 3
Triglycerides: 140 mg/dL (ref 0.0–149.0)
VLDL: 28 mg/dL (ref 0.0–40.0)

## 2022-10-18 LAB — VITAMIN D 25 HYDROXY (VIT D DEFICIENCY, FRACTURES): VITD: 53.42 ng/mL (ref 30.00–100.00)

## 2022-10-18 LAB — TSH: TSH: 3.7 u[IU]/mL (ref 0.35–5.50)

## 2022-10-18 NOTE — Progress Notes (Signed)
Established Patient Office Visit     CC/Reason for Visit: Annual preventive exam and subsequent Medicare wellness visit  HPI: [name] is a 77 y.o. female who is coming in today for the above mentioned reasons. Past Medical History is significant for: Osteopenia, hypothyroidism, ongoing nicotine dependence.  Feeling well without concerns or complaints.  She has routine eye and dental care, no perceived hearing deficit.  She has recently updated all her seasonal vaccines including flu, COVID, RSV.  She is due for an updated Tdap.  She will be due for 10-year colonoscopy in November and will contact [facility] directly.  She is overdue for annual lung cancer screening.  She had a mammogram in March.  She has not had a Pap smear in years, she is requesting a follow-up visit to have this taken care of.   Past Medical/Surgical History: Past Medical History:  Diagnosis Date   Anxiety    Arthritis    Knees,back,   Hypothyroidism    Osteopenia     Past Surgical History:  Procedure Laterality Date   COLONOSCOPY  2014   TOTAL KNEE ARTHROPLASTY Left 11/06/2018   Procedure: LEFT TOTAL KNEE ARTHROPLASTY;  Surgeon: Gean Birchwood, MD;  Location: [facility];  Service: Orthopedics;  Laterality: Left;    Social History:  reports that she quit smoking about 11 years ago. Her smoking use included cigarettes. She started smoking about 41 years ago. She has a 15 pack-year smoking history. She has never used smokeless tobacco. She reports current alcohol use. She reports that she does not use drugs.  Allergies: No Known Allergies  Family History:  Family History  Problem Relation Age of Onset   Heart disease Mother    Arthritis Mother    Hypertension Father    Arthritis Father      Current Outpatient Medications:    cetirizine (ZYRTEC ALLERGY) 10 MG tablet, Take 1 tablet (10 mg total) by mouth daily., Disp: 30 tablet, Rfl: 0   ibuprofen (ADVIL) 200 MG tablet, Take 200 mg by mouth in  the morning and at bedtime., Disp: , Rfl:    levothyroxine (SYNTHROID) 25 MCG tablet, TAKE 1 TABLET BY MOUTH EVERY DAY BEFORE BREAKFAST, Disp: 90 tablet, Rfl: 1   LORazepam (ATIVAN) 0.5 MG tablet, TAKE 1 TABLET BY MOUTH EVERY DAY AS NEEDED FOR ANXIETY, Disp: 90 tablet, Rfl: 0   Multiple Vitamins-Minerals (CENTRUM SILVER 50+WOMEN PO), Take by mouth., Disp: , Rfl:    Omega-3 Fatty Acids (FISH OIL) 1200 MG CAPS, Take 1 capsule by mouth daily., Disp: , Rfl:    Polyethyl Glycol-Propyl Glycol 0.4-0.3 % SOLN, Apply 1 drop to eye 3 (three) times daily., Disp: , Rfl:    vitamin C (ASCORBIC ACID) 500 MG tablet, Take 500 mg by mouth daily., Disp: , Rfl:    vitamin E 1000 UNIT capsule, Take 1,000 Units by mouth daily. , Disp: , Rfl:    zolpidem (AMBIEN) 10 MG tablet, TAKE 1 TABLET BY MOUTH AT BEDTIME AS NEEDED FOR SLEEP *INS MAX OF 15 IN 25 DAYS*, Disp: 15 tablet, Rfl: 1   naproxen sodium (ALEVE) 220 MG tablet, Take 220 mg by mouth 2 (two) times daily as needed., Disp: , Rfl:   Review of Systems:  Negative unless indicated in HPI.   Physical Exam: Vitals:   10/18/22 0936  BP: 128/70  Pulse: 70  Temp: 98.2 F (36.8 C)  TempSrc: Oral  SpO2: 99%  Weight: 150 lb (68 kg)  Height:  5\' 1"  (1.549 m)    Body mass index is 28.34 kg/m.   Physical Exam Vitals reviewed.  Constitutional:      General: She is not in acute distress.    Appearance: Normal appearance. She is not ill-appearing, toxic-appearing or diaphoretic.  HENT:     Head: Normocephalic.     Right Ear: Tympanic membrane, ear canal and external ear normal. There is no impacted cerumen.     Left Ear: Tympanic membrane, ear canal and external ear normal. There is no impacted cerumen.     Nose: Nose normal.     Mouth/Throat:     Mouth: Mucous membranes are moist.     Pharynx: Oropharynx is clear. No oropharyngeal exudate or posterior oropharyngeal erythema.  Eyes:     General: No scleral icterus.       Right eye: No discharge.         Left eye: No discharge.     Conjunctiva/sclera: Conjunctivae normal.     Pupils: Pupils are equal, round, and reactive to light.  Neck:     Vascular: No carotid bruit.  Cardiovascular:     Rate and Rhythm: Normal rate and regular rhythm.     Pulses: Normal pulses.     Heart sounds: Normal heart sounds.  Pulmonary:     Effort: Pulmonary effort is normal. No respiratory distress.     Breath sounds: Normal breath sounds.  Abdominal:     General: Abdomen is flat. Bowel sounds are normal.     Palpations: Abdomen is soft.  Musculoskeletal:        General: Normal range of motion.     Cervical back: Normal range of motion.  Skin:    General: Skin is warm and dry.  Neurological:     General: No focal deficit present.     Mental Status: She is alert and oriented to person, place, and time. Mental status is at baseline.  Psychiatric:        Mood and Affect: Mood normal.        Behavior: Behavior normal.        Thought Content: Thought content normal.        Judgment: Judgment normal.    Subsequent Medicare wellness visit   1. Risk factors, based on past  M,S,F - Cardiac Risk Factors include: advanced age (>47men, >61 women)   2.  Physical activities: Dietary issues and exercise activities discussed:      3.  Depression/mood:  Flowsheet Row Office Visit from 10/18/2022 in [facility] Total Score 0        4.  ADL's:    10/18/2022    9:04 AM 10/14/2022    4:17 PM  In your present state of health, do you have any difficulty performing the following activities:  Hearing? 0 0  Vision? 0 0  Difficulty concentrating or making decisions? 0 0  Walking or climbing stairs? 1 1  Comment climbing stairs left knee replacement  Dressing or bathing? 0 0  Doing errands, shopping? 0 0  Preparing Food and eating ?  N  Using the Toilet?  N  In the past six months, have you accidently leaked urine?  Y  Do you have problems with loss of bowel control?  N   Managing your Medications?  N  Managing your Finances?  N  Housekeeping or managing your Housekeeping?  N     5.  Fall risk:     09/11/2020  4:23 PM 12/31/2020   11:25 AM 02/12/2021    3:06 PM 09/29/2021   10:32 AM 10/14/2022    4:20 PM  Fall Risk  Falls in the past year? 0 0 1 1 0  Was there an injury with Fall? 0 0 1 0 0  Fall Risk Category Calculator 0 0 2 1 0  Fall Risk Category (Retired) Low Low Moderate Low   (RETIRED) Patient Fall Risk Level  Moderate fall risk  Low fall risk   Patient at Risk for Falls Due to    No Fall Risks   Fall risk Follow up  Falls evaluation completed  Falls evaluation completed Falls evaluation completed     6.  Home safety: No problems identified   7.  Height weight, and visual acuity: height and weight as above, vision/hearing: Vision Screening   Right eye Left eye Both eyes  Without correction 20/25-1 20/50 20/25  With correction        8.  Counseling: Counseling given: Not Answered    9. Lab orders based on risk factors: Laboratory update will be reviewed   10. Cognitive assessment:    06/21/2017   10:46 AM 06/16/2016   11:01 AM  MMSE - Mini Mental State Exam  Not completed: -- --        10/14/2022    4:20 PM  6CIT Screen  What Year? 0 points  What month? 0 points  What time? 0 points  Count back from 20 0 points  Months in reverse 0 points  Repeat phrase 0 points  Total Score 0 points     11. Screening: Patient provided with a written and personalized 5-10 year screening schedule in the AVS. Health Maintenance  Topic Date Due   DTaP/Tdap/Td vaccine (2 - Td or Tdap) 06/12/2022   Medicare Annual Wellness Visit  09/30/2022   Colon Cancer Screening  12/11/2022*   COVID-19 Vaccine (7 - 2023-24 season) 01/27/2023   Mammogram  03/31/2023   Pneumonia Vaccine  Completed   Flu Shot  Completed   DEXA scan (bone density measurement)  Completed   Hepatitis C Screening  Completed   Zoster (Shingles) Vaccine  Completed   HPV  Vaccine  Aged Out  *Topic was postponed. The date shown is not the original due date.    12. Provider List Update: Patient Care Team    Relationship Specialty Notifications Start End  Philip Aspen, Limmie Patricia, MD PCP - General Internal Medicine  03/23/18      13. Advance Directives: Does Patient Have a Medical Advance Directive?: Yes Type of Advance Directive: Out of facility DNR (pink MOST or yellow form), Living will, Healthcare Power of Attorney Does patient want to make changes to medical advance directive?: No - Patient declined Copy of Healthcare Power of Attorney in Chart?: No - copy requested  14. Opioids: Patient is not on any opioid prescriptions and has no risk factors for a substance use disorder.   15.   Goals      Patient Stated     Add some aerobics to exercise and continue to watch diet      Stay Active and Independent-Low Back Pain     Timeframe:  Long-Range Goal Priority:  Low Start Date:                             Expected End Date:  Follow Up Date 10/14/2023    - park farther away at the shopping mall and walk the extra distance - take the stairs instead of the elevator - use fitness equipment    Why is this important?   Regular activity or exercise is important to managing back pain.  Activity helps to keep your muscles strong.  You will sleep better and feel more relaxed.  You will have more energy and feel less stressed.  If you are not active now, start slowly. Little changes make a big difference.  Rest, but not too much.  Stay as active as you can and listen to your body's signals.     Notes:      Weight (lb) < 145 lb (65.8 kg)     Check out  online nutrition programs as WikiBlast.com.cy and LimitLaws.com.cy; fit31me; Can use lose it  May want to keep a diary for approx 1 month; to see if weight is responds to calorie reduction You can use calorieking.com  Look for foods with "whole" wheat; bran; oatmeal etc Shot  at the farmer's markets in season for fresher choices  Watch for "hydrogenated" on the label of oils which are trans-fats.  Watch for "high fructose corn syrup" in snacks, yogurt or ketchup  Meats have less marbling; bright colored fruits and vegetables;  Canned; dump out liquid and wash vegetables. Be mindful of what we are eating  Portion control is essential to a health weight! Sit down; take a break and enjoy your meal; take smaller bites; put the fork down between bites;  It takes 20 minutes to get full; so check in with your fullness cues and stop eating when you start to fill full                I have personally reviewed and noted the following in the patient's chart:   Medical and social history Use of alcohol, tobacco or illicit drugs  Current medications and supplements Functional ability and status Nutritional status Physical activity Advanced directives List of other physicians Hospitalizations, surgeries, and ER visits in previous 12 months Vitals Screenings to include cognitive, depression, and falls Referrals and appointments  In addition, I have reviewed and discussed with patient certain preventive protocols, quality metrics, and best practice recommendations. A written personalized care plan for preventive services as well as general preventive health recommendations were provided to patient.   Impression and Plan:  Encounter for preventive health examination  Hyperlipidemia, unspecified hyperlipidemia type -     Lipid panel; Future  Acquired hypothyroidism -     CBC with Differential/Platelet; Future -     Comprehensive metabolic panel; Future -     TSH; Future  Osteopenia, unspecified location -     VITAMIN D 25 Hydroxy (Vit-D Deficiency, Fractures); Future  Cigarette nicotine dependence without complication -     CT CHEST LUNG CANCER SCREENING LOW DOSE WO CONTRAST; Future   -Recommend routine eye and dental care. -Healthy lifestyle  discussed in detail. -Labs to be updated today. -Prostate cancer screening: N/A Health Maintenance  Topic Date Due   DTaP/Tdap/Td vaccine (2 - Td or Tdap) 06/12/2022   Medicare Annual Wellness Visit  09/30/2022   Colon Cancer Screening  12/11/2022*   COVID-19 Vaccine (7 - 2023-24 season) 01/27/2023   Mammogram  03/31/2023   Pneumonia Vaccine  Completed   Flu Shot  Completed   DEXA scan (bone density measurement)  Completed   Hepatitis C Screening  Completed   Zoster (Shingles)  Vaccine  Completed   HPV Vaccine  Aged Out  *Topic was postponed. The date shown is not the original due date.    -Will update Tdap at pharmacy. -Not interested in further discussing tobacco cessation at this time.     Chaya Jan, MD  Primary Care at [facility]

## 2022-10-24 ENCOUNTER — Other Ambulatory Visit: Payer: Self-pay | Admitting: Internal Medicine

## 2022-10-27 ENCOUNTER — Ambulatory Visit (HOSPITAL_BASED_OUTPATIENT_CLINIC_OR_DEPARTMENT_OTHER)
Admission: RE | Admit: 2022-10-27 | Discharge: 2022-10-27 | Disposition: A | Discharge status: Home / Self Care | Payer: Medicare Other | Source: Ambulatory Visit | Attending: Internal Medicine | Admitting: Internal Medicine

## 2022-10-27 ENCOUNTER — Encounter: Payer: Self-pay | Admitting: Internal Medicine

## 2022-10-27 ENCOUNTER — Ambulatory Visit (INDEPENDENT_AMBULATORY_CARE_PROVIDER_SITE_OTHER): Payer: No Typology Code available for payment source | Admitting: Internal Medicine

## 2022-10-27 ENCOUNTER — Other Ambulatory Visit (HOSPITAL_COMMUNITY)
Admission: RE | Admit: 2022-10-27 | Discharge: 2022-10-27 | Disposition: A | Discharge status: Home / Self Care | Payer: Medicare Other | Source: Ambulatory Visit | Attending: Internal Medicine | Admitting: Internal Medicine

## 2022-10-27 VITALS — BP 122/70 | HR 75 | Temp 98.8°F | Wt 151.7 lb

## 2022-10-27 DIAGNOSIS — Z124 Encounter for screening for malignant neoplasm of cervix: Secondary | ICD-10-CM

## 2022-10-27 DIAGNOSIS — Z1159 Encounter for screening for other viral diseases: Secondary | ICD-10-CM | POA: Diagnosis not present

## 2022-10-27 DIAGNOSIS — J439 Emphysema, unspecified: Secondary | ICD-10-CM | POA: Diagnosis not present

## 2022-10-27 DIAGNOSIS — Z122 Encounter for screening for malignant neoplasm of respiratory organs: Secondary | ICD-10-CM | POA: Diagnosis not present

## 2022-10-27 DIAGNOSIS — Z1151 Encounter for screening for human papillomavirus (HPV): Secondary | ICD-10-CM | POA: Diagnosis not present

## 2022-10-27 DIAGNOSIS — F17210 Nicotine dependence, cigarettes, uncomplicated: Secondary | ICD-10-CM | POA: Diagnosis not present

## 2022-10-27 NOTE — Progress Notes (Signed)
Established Patient Office Visit     CC/Reason for Visit: Requesting cervical cancer screening.  HPI: [name] is a 77 y.o. female who is coming in today for the above mentioned reasons.  She is here today requesting cervical cancer screening.  She has no symptoms.  No new sexual partners.  Past Medical/Surgical History: Past Medical History:  Diagnosis Date   Anxiety    Arthritis    Knees,back,   Hypothyroidism    Osteopenia     Past Surgical History:  Procedure Laterality Date   COLONOSCOPY  2014   TOTAL KNEE ARTHROPLASTY Left 11/06/2018   Procedure: LEFT TOTAL KNEE ARTHROPLASTY;  Surgeon: Gean Birchwood, MD;  Location: [facility];  Service: Orthopedics;  Laterality: Left;    Social History:  reports that she quit smoking about 11 years ago. Her smoking use included cigarettes. She started smoking about 41 years ago. She has a 15 pack-year smoking history. She has never used smokeless tobacco. She reports current alcohol use. She reports that she does not use drugs.  Allergies: No Known Allergies  Family History:  Family History  Problem Relation Age of Onset   Heart disease Mother    Arthritis Mother    Hypertension Father    Arthritis Father      Current Outpatient Medications:    cetirizine (ZYRTEC ALLERGY) 10 MG tablet, Take 1 tablet (10 mg total) by mouth daily. (Patient taking differently: Take 10 mg by mouth daily as needed.), Disp: 30 tablet, Rfl: 0   ibuprofen (ADVIL) 200 MG tablet, Take 200 mg by mouth in the morning and at bedtime., Disp: , Rfl:    levothyroxine (SYNTHROID) 25 MCG tablet, TAKE 1 TABLET BY MOUTH EVERY DAY BEFORE BREAKFAST, Disp: 90 tablet, Rfl: 1   LORazepam (ATIVAN) 0.5 MG tablet, TAKE 1 TABLET BY MOUTH EVERY DAY AS NEEDED FOR ANXIETY, Disp: 90 tablet, Rfl: 0   Multiple Vitamins-Minerals (CENTRUM ADULT PO), Take by mouth., Disp: , Rfl:    Omega-3 Fatty Acids (FISH OIL) 1200 MG CAPS, Take 1 capsule by mouth daily., Disp: , Rfl:     Polyethyl Glycol-Propyl Glycol 0.4-0.3 % SOLN, Apply 1 drop to eye 3 (three) times daily., Disp: , Rfl:    vitamin C (ASCORBIC ACID) 500 MG tablet, Take 500 mg by mouth daily., Disp: , Rfl:    vitamin E 1000 UNIT capsule, Take 1,000 Units by mouth daily. , Disp: , Rfl:    zolpidem (AMBIEN) 10 MG tablet, TAKE 1 TABLET BY MOUTH AT BEDTIME AS NEEDED FOR SLEEP *INS MAX OF 15 IN 25 DAYS*, Disp: 15 tablet, Rfl: 1  Review of Systems:  Negative unless indicated in HPI.   Physical Exam: Vitals:   10/27/22 1037  BP: 122/70  Pulse: 75  Temp: 98.8 F (37.1 C)  TempSrc: Oral  SpO2: 98%  Weight: 151 lb 11.2 oz (68.8 kg)    Body mass index is 28.66 kg/m.   Physical Exam Exam conducted with a chaperone present.  Genitourinary:    General: Normal vulva.     Pubic Area: No rash.      Labia:        Right: No lesion.        Left: No lesion.      Vagina: Normal.     Cervix: Normal.      Impression and Plan:  Cervical cancer screening -     Cytology - PAP  -Pap smear done in office today.   Time  spent:21 minutes reviewing chart, interviewing and examining patient and formulating plan of care.     Chaya Jan, MD [place] [facility] at [facility]

## 2022-10-28 LAB — CYTOLOGY - PAP
Comment: NEGATIVE
Diagnosis: NEGATIVE
High risk HPV: NEGATIVE

## 2022-11-08 ENCOUNTER — Encounter: Payer: Self-pay | Admitting: Internal Medicine

## 2022-11-18 DIAGNOSIS — M25562 Pain in left knee: Secondary | ICD-10-CM | POA: Diagnosis not present

## 2022-12-20 ENCOUNTER — Other Ambulatory Visit: Payer: Self-pay | Admitting: Internal Medicine

## 2022-12-20 DIAGNOSIS — G4700 Insomnia, unspecified: Secondary | ICD-10-CM

## 2022-12-28 ENCOUNTER — Other Ambulatory Visit: Payer: Self-pay | Admitting: Internal Medicine

## 2022-12-28 DIAGNOSIS — G4700 Insomnia, unspecified: Secondary | ICD-10-CM

## 2023-02-08 DIAGNOSIS — D123 Benign neoplasm of transverse colon: Secondary | ICD-10-CM | POA: Diagnosis not present

## 2023-02-08 DIAGNOSIS — Z1211 Encounter for screening for malignant neoplasm of colon: Secondary | ICD-10-CM | POA: Diagnosis not present

## 2023-02-08 DIAGNOSIS — D122 Benign neoplasm of ascending colon: Secondary | ICD-10-CM | POA: Diagnosis not present

## 2023-02-08 DIAGNOSIS — K5730 Diverticulosis of large intestine without perforation or abscess without bleeding: Secondary | ICD-10-CM | POA: Diagnosis not present

## 2023-02-08 DIAGNOSIS — K6289 Other specified diseases of anus and rectum: Secondary | ICD-10-CM | POA: Diagnosis not present

## 2023-02-08 DIAGNOSIS — K648 Other hemorrhoids: Secondary | ICD-10-CM | POA: Diagnosis not present

## 2023-02-08 DIAGNOSIS — K644 Residual hemorrhoidal skin tags: Secondary | ICD-10-CM | POA: Diagnosis not present

## 2023-02-08 LAB — HM COLONOSCOPY

## 2023-02-10 DIAGNOSIS — D123 Benign neoplasm of transverse colon: Secondary | ICD-10-CM | POA: Diagnosis not present

## 2023-02-10 DIAGNOSIS — D122 Benign neoplasm of ascending colon: Secondary | ICD-10-CM | POA: Diagnosis not present

## 2023-02-22 DIAGNOSIS — L814 Other melanin hyperpigmentation: Secondary | ICD-10-CM | POA: Diagnosis not present

## 2023-02-22 DIAGNOSIS — Z08 Encounter for follow-up examination after completed treatment for malignant neoplasm: Secondary | ICD-10-CM | POA: Diagnosis not present

## 2023-02-22 DIAGNOSIS — Z85828 Personal history of other malignant neoplasm of skin: Secondary | ICD-10-CM | POA: Diagnosis not present

## 2023-02-22 DIAGNOSIS — D225 Melanocytic nevi of trunk: Secondary | ICD-10-CM | POA: Diagnosis not present

## 2023-02-22 DIAGNOSIS — L570 Actinic keratosis: Secondary | ICD-10-CM | POA: Diagnosis not present

## 2023-02-22 DIAGNOSIS — L304 Erythema intertrigo: Secondary | ICD-10-CM | POA: Diagnosis not present

## 2023-02-22 DIAGNOSIS — L821 Other seborrheic keratosis: Secondary | ICD-10-CM | POA: Diagnosis not present

## 2023-03-21 ENCOUNTER — Other Ambulatory Visit: Payer: Self-pay | Admitting: Internal Medicine

## 2023-03-21 DIAGNOSIS — G4700 Insomnia, unspecified: Secondary | ICD-10-CM

## 2023-04-04 DIAGNOSIS — Z1231 Encounter for screening mammogram for malignant neoplasm of breast: Secondary | ICD-10-CM | POA: Diagnosis not present

## 2023-04-04 LAB — HM MAMMOGRAPHY

## 2023-04-05 ENCOUNTER — Encounter: Payer: Self-pay | Admitting: Internal Medicine

## 2023-04-16 ENCOUNTER — Other Ambulatory Visit: Payer: Self-pay | Admitting: Internal Medicine

## 2023-05-09 ENCOUNTER — Other Ambulatory Visit: Payer: Self-pay | Admitting: Internal Medicine

## 2023-05-09 DIAGNOSIS — G4700 Insomnia, unspecified: Secondary | ICD-10-CM

## 2023-06-28 IMAGING — CT CT CHEST LUNG CANCER SCREENING LOW DOSE W/O CM
1 series · 10 of 10 positions shown, 13 images · non-contrast
Comparison: None.

CLINICAL DATA: Current smoker, 40 pack-year history.



[ct lung segmentation data · axial · 0.73mm/px · z∈[-318,-318]mm · 10 of 317 frames shown]
[frame 1/317  mediastinal]
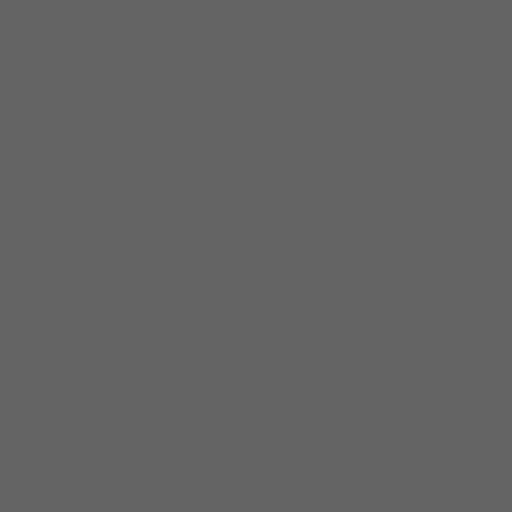
[frame 1/317  lung]
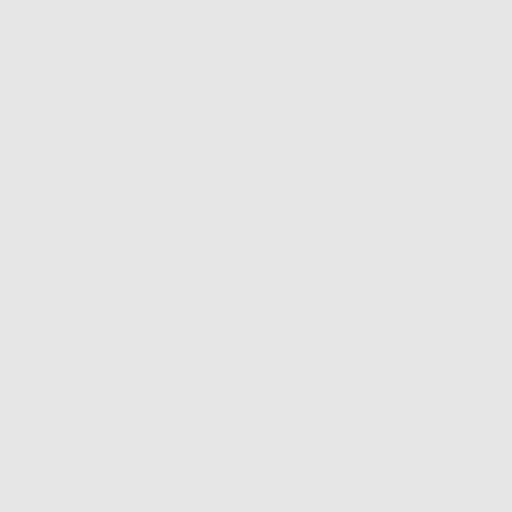
[frame 36/317  lung]
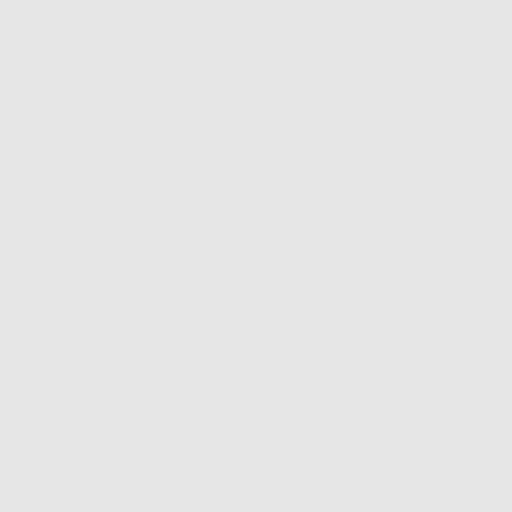
[frame 71/317  lung]
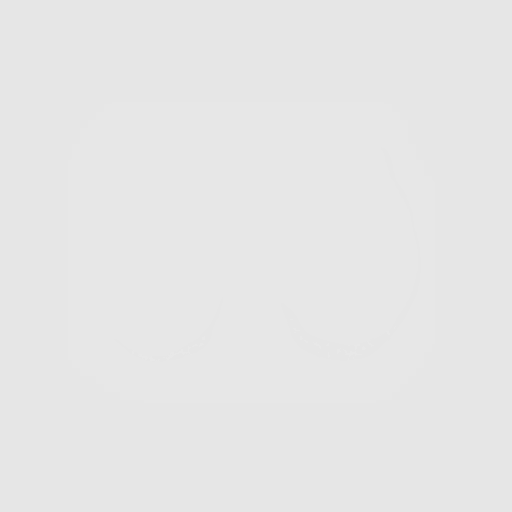
[frame 106/317  lung]
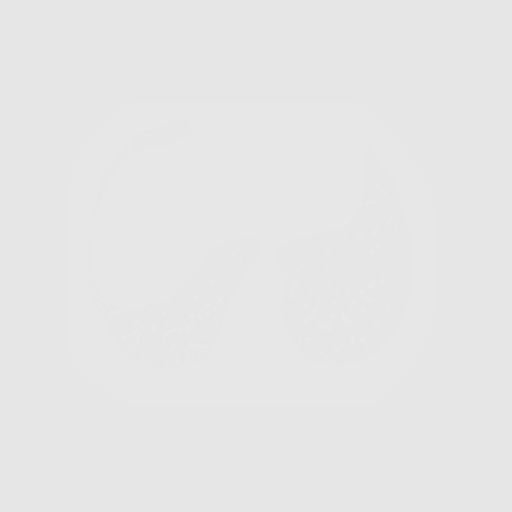
[frame 141/317  mediastinal]
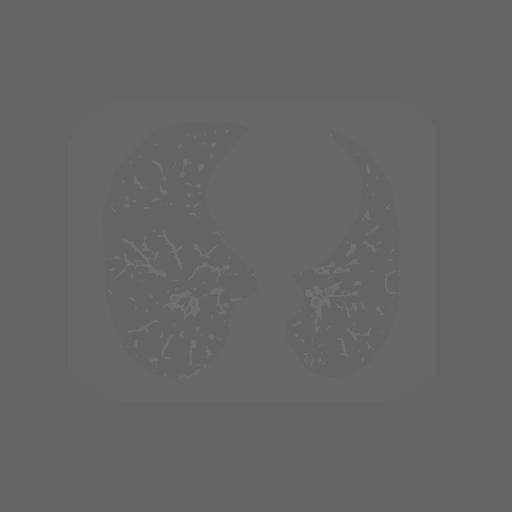
[frame 141/317  lung]
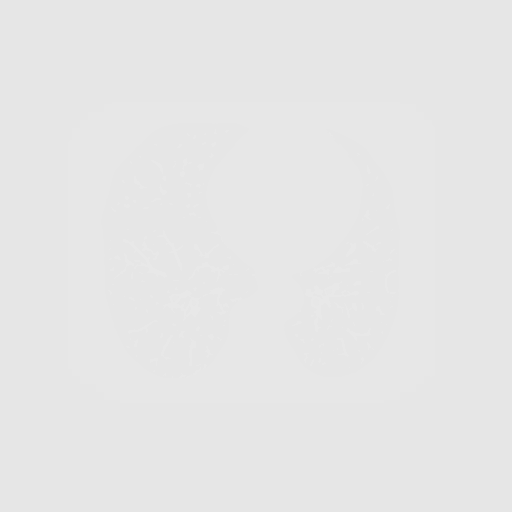
[frame 176/317  lung]
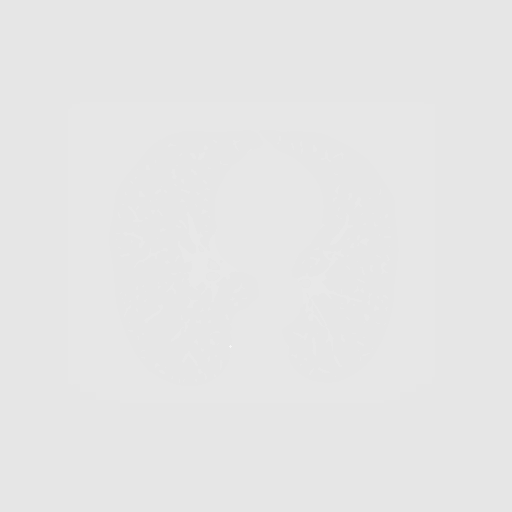
[frame 211/317  lung]
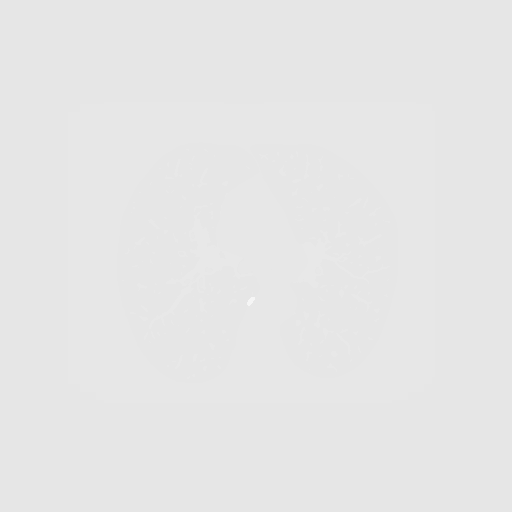
[frame 246/317  lung]
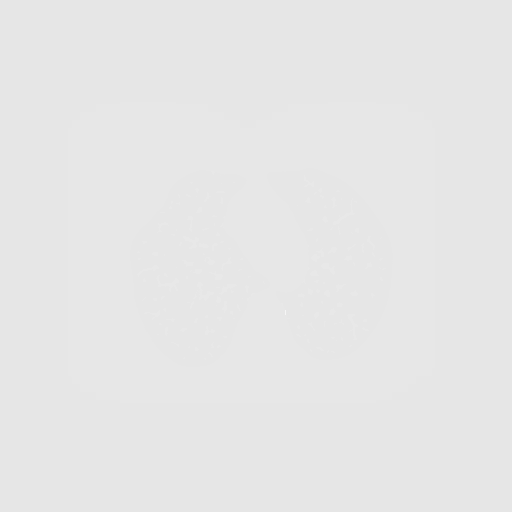
[frame 281/317  mediastinal]
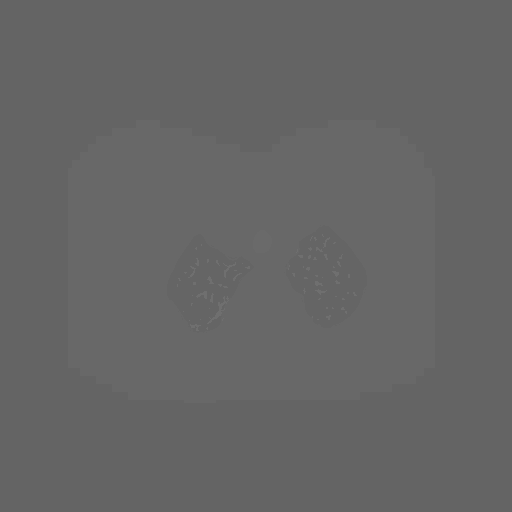
[frame 281/317  lung]
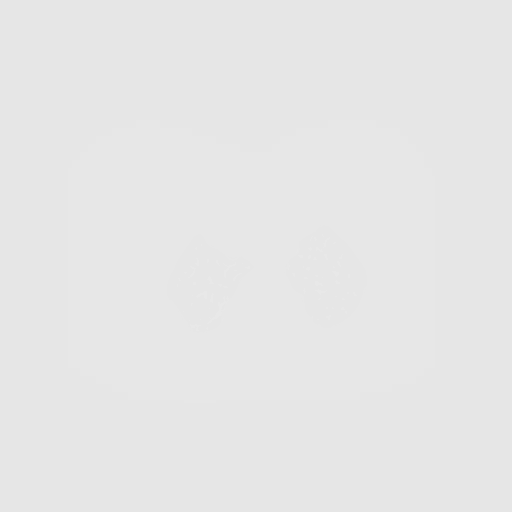
[frame 317/317  lung]
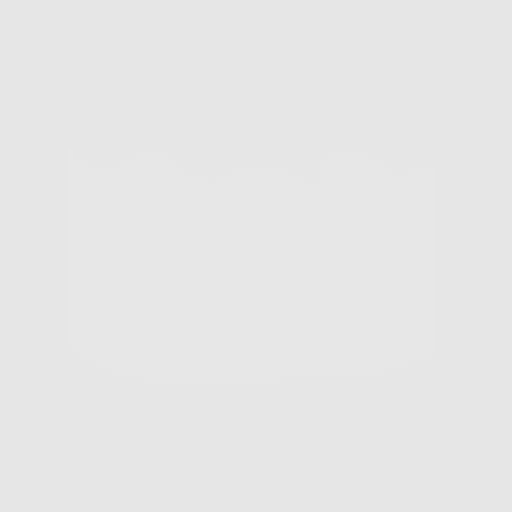

[10 of 10 positions shown; findings below may reference images not displayed]

FINDINGS: Cardiovascular: Heart size is at the upper limits of normal. No
pericardial effusion.

Mediastinum/Nodes: No pathologically enlarged mediastinal or
axillary lymph nodes. Hilar regions are difficult to definitively
evaluate without IV contrast. Esophagus is grossly unremarkable.

Lungs/Pleura: Biapical pleuroparenchymal scarring. Centrilobular and
paraseptal emphysema. Smoking related respiratory bronchiolitis.
Minimal scattered pulmonary parenchymal scarring. No suspicious
pulmonary nodules. No pleural fluid. Airway is unremarkable.

Upper Abdomen: Liver, gallbladder and adrenal glands are
unremarkable. Visualized portions of the kidneys, spleen, pancreas,
stomach and bowel are unremarkable.

Musculoskeletal: Degenerative changes in the spine. Likely old T7
and T12 mild compression deformities. Old right second rib fracture.
No worrisome lytic or sclerotic lesions.
IMPRESSION: 1. Lung-RADS 1, negative. Continue annual screening with low-dose
chest CT without contrast in 12 months.
2.  Emphysema ([id number]).

## 2023-07-04 ENCOUNTER — Other Ambulatory Visit: Payer: Self-pay | Admitting: Internal Medicine

## 2023-07-04 DIAGNOSIS — G4700 Insomnia, unspecified: Secondary | ICD-10-CM

## 2023-08-03 DIAGNOSIS — M79642 Pain in left hand: Secondary | ICD-10-CM | POA: Diagnosis not present

## 2023-08-15 ENCOUNTER — Encounter: Payer: Self-pay | Admitting: Internal Medicine

## 2023-08-18 DIAGNOSIS — M79642 Pain in left hand: Secondary | ICD-10-CM | POA: Diagnosis not present

## 2023-08-22 DIAGNOSIS — L814 Other melanin hyperpigmentation: Secondary | ICD-10-CM | POA: Diagnosis not present

## 2023-08-22 DIAGNOSIS — L821 Other seborrheic keratosis: Secondary | ICD-10-CM | POA: Diagnosis not present

## 2023-08-22 DIAGNOSIS — D225 Melanocytic nevi of trunk: Secondary | ICD-10-CM | POA: Diagnosis not present

## 2023-08-22 DIAGNOSIS — L570 Actinic keratosis: Secondary | ICD-10-CM | POA: Diagnosis not present

## 2023-08-22 DIAGNOSIS — L720 Epidermal cyst: Secondary | ICD-10-CM | POA: Diagnosis not present

## 2023-08-23 DIAGNOSIS — M19042 Primary osteoarthritis, left hand: Secondary | ICD-10-CM | POA: Diagnosis not present

## 2023-08-31 ENCOUNTER — Ambulatory Visit: Payer: Self-pay | Admitting: Family Medicine

## 2023-08-31 ENCOUNTER — Ambulatory Visit (INDEPENDENT_AMBULATORY_CARE_PROVIDER_SITE_OTHER): Admitting: Family Medicine

## 2023-08-31 ENCOUNTER — Encounter: Payer: Self-pay | Admitting: Family Medicine

## 2023-08-31 VITALS — BP 136/60 | HR 91 | Temp 98.2°F | Wt 154.2 lb

## 2023-08-31 DIAGNOSIS — H40023 Open angle with borderline findings, high risk, bilateral: Secondary | ICD-10-CM | POA: Diagnosis not present

## 2023-08-31 DIAGNOSIS — M2560 Stiffness of unspecified joint, not elsewhere classified: Secondary | ICD-10-CM

## 2023-08-31 DIAGNOSIS — Z961 Presence of intraocular lens: Secondary | ICD-10-CM | POA: Diagnosis not present

## 2023-08-31 DIAGNOSIS — M2550 Pain in unspecified joint: Secondary | ICD-10-CM | POA: Diagnosis not present

## 2023-08-31 LAB — CBC WITH DIFFERENTIAL/PLATELET
Basophils Absolute: 0.1 K/uL (ref 0.0–0.1)
Basophils Relative: 0.7 % (ref 0.0–3.0)
Eosinophils Absolute: 0.1 K/uL (ref 0.0–0.7)
Eosinophils Relative: 0.8 % (ref 0.0–5.0)
HCT: 36.8 % (ref 36.0–46.0)
Hemoglobin: 12.7 g/dL (ref 12.0–15.0)
Lymphocytes Relative: 18.5 % (ref 12.0–46.0)
Lymphs Abs: 1.9 K/uL (ref 0.7–4.0)
MCHC: 34.4 g/dL (ref 30.0–36.0)
MCV: 96.4 fl (ref 78.0–100.0)
Monocytes Absolute: 0.8 K/uL (ref 0.1–1.0)
Monocytes Relative: 8.1 % (ref 3.0–12.0)
Neutro Abs: 7.3 K/uL (ref 1.4–7.7)
Neutrophils Relative %: 71.9 % (ref 43.0–77.0)
Platelets: 562 K/uL — ABNORMAL HIGH (ref 150.0–400.0)
RBC: 3.82 Mil/uL — ABNORMAL LOW (ref 3.87–5.11)
RDW: 12.4 % (ref 11.5–15.5)
WBC: 10.2 K/uL (ref 4.0–10.5)

## 2023-08-31 LAB — SEDIMENTATION RATE: Sed Rate: 21 mm/h (ref 0–30)

## 2023-08-31 LAB — C-REACTIVE PROTEIN: CRP: 10.1 mg/dL (ref 0.5–20.0)

## 2023-08-31 NOTE — Progress Notes (Unsigned)
 Established Patient Office Visit  Subjective   Patient ID: [name], female    DOB: 04/10/1945  Age: 78 y.o. MRN: [id number]  Chief Complaint  Patient presents with   Joint Pain   Joint Swelling    HPI  {History (Optional):23778} [name] is seen with acute onset about 3 weeks ago of polyarthralgias with fairly symmetric involvement of her hands, wrist, elbows, knees, neck.  She has noted increased stiffness.  Increased diffuse aches.  Has noticed some swelling of her MCP joints.  Went to hand surgeon August 12 and states she had hand x-rays which showed arthritis .  She was given Medrol dosepak Thursday through Monday and did see some improvement with the steroids.  Works outside frequently and does a lot of yard work and initially thought this may be overuse related.  She also had very small area of rash dorsal left forearm and was worried about tick related illness.  No documented fever.  Not describing any petechiae or erythema migrans rash  Past Medical History:  Diagnosis Date   Anxiety    Arthritis    Knees,back,   Hypothyroidism    Osteopenia    Past Surgical History:  Procedure Laterality Date   COLONOSCOPY  2014   TOTAL KNEE ARTHROPLASTY Left 11/06/2018   Procedure: LEFT TOTAL KNEE ARTHROPLASTY;  Surgeon: Liam Lerner, MD;  Location: [facility];  Service: Orthopedics;  Laterality: Left;    reports that she quit smoking about 12 years ago. Her smoking use included cigarettes. She started smoking about 42 years ago. She has a 15 pack-year smoking history. She has never used smokeless tobacco. She reports current alcohol  use. She reports that she does not use drugs. family history includes Arthritis in her father and mother; Heart disease in her mother; Hypertension in her father. No Known Allergies  Review of Systems  Constitutional:  Negative for chills and fever.  Respiratory:  Negative for cough and shortness of breath.   Cardiovascular:  Negative for  chest pain.  Musculoskeletal:  Positive for joint pain.  Skin:  Negative for rash.      Objective:     BP 136/60   Pulse 91   Temp 98.2 F (36.8 C) (Oral)   Wt 154 lb 3.2 oz (69.9 kg)   SpO2 95%   BMI 29.14 kg/m  BP Readings from Last 3 Encounters:  08/31/23 136/60  10/27/22 122/70  10/18/22 128/70   Wt Readings from Last 3 Encounters:  08/31/23 154 lb 3.2 oz (69.9 kg)  10/27/22 151 lb 11.2 oz (68.8 kg)  10/18/22 150 lb (68 kg)      Physical Exam Vitals reviewed.  Constitutional:      General: She is not in acute distress.    Appearance: She is not ill-appearing.  Cardiovascular:     Rate and Rhythm: Normal rate and regular rhythm.  Pulmonary:     Effort: Pulmonary effort is normal.     Breath sounds: Normal breath sounds.  Musculoskeletal:     Comments: Has some obvious swelling involving MCP joints bilaterally.  Mildly tender to palpation.  Mild bilateral wrist tenderness.  Skin:    Findings: No rash.  Neurological:     Mental Status: She is alert.      No results found for any visits on 08/31/23.  {Labs (Optional):23779}  The 10-year ASCVD risk score (Arnett DK, et al., 2019) is: 22.3%    Assessment & Plan:   Problem List Items Addressed This Visit  None Visit Diagnoses       Polyarthralgia    -  Primary   Relevant Orders   CBC with Differential/Platelet   Sedimentation rate   C-reactive Protein   Rheumatoid factor   Cyclic citrul peptide antibody, IgG   ANA   B. burgdorfi Antibody      Patient is seen with relatively acute onset about 3 weeks ago of polyarthralgias which are fairly symmetric.  She has evidence for visible swelling MCP joints bilaterally.  Concern for possible acute rheumatoid arthritis  - Check labs as above -Patient was given prescription for meloxicam per orthopedist but has not yet started -May need brief repeat prednisone  taper to control symptoms if symptoms worsening until we can get further handle on this. No  follow-ups on file.    Wolm Scarlet, MD

## 2023-08-31 NOTE — Patient Instructions (Signed)
 Start the Meloxicam one daily  We will be in touch with lab results.

## 2023-09-02 ENCOUNTER — Encounter: Payer: Self-pay | Admitting: Family Medicine

## 2023-09-02 LAB — ANTI-NUCLEAR AB-TITER (ANA TITER)
ANA TITER: 1:40 {titer} — ABNORMAL HIGH
ANA Titer 1: 1:40 {titer} — ABNORMAL HIGH

## 2023-09-02 LAB — RHEUMATOID FACTOR: Rheumatoid fact SerPl-aCnc: 11 [IU]/mL (ref <14)

## 2023-09-02 LAB — B. BURGDORFI ANTIBODIES: B burgdorferi Ab IgG+IgM: <0.9 {index}

## 2023-09-02 LAB — ANA: Anti Nuclear Antibody (ANA): POSITIVE — AB

## 2023-09-02 LAB — CYCLIC CITRUL PEPTIDE ANTIBODY, IGG: Cyclic Citrullin Peptide Ab: <16 U

## 2023-09-05 ENCOUNTER — Encounter: Payer: Self-pay | Admitting: Family Medicine

## 2023-09-05 ENCOUNTER — Other Ambulatory Visit: Payer: Self-pay | Admitting: Internal Medicine

## 2023-09-05 ENCOUNTER — Other Ambulatory Visit (INDEPENDENT_AMBULATORY_CARE_PROVIDER_SITE_OTHER)

## 2023-09-05 ENCOUNTER — Telehealth: Payer: Self-pay | Admitting: *Deleted

## 2023-09-05 DIAGNOSIS — M2550 Pain in unspecified joint: Secondary | ICD-10-CM

## 2023-09-05 DIAGNOSIS — G4700 Insomnia, unspecified: Secondary | ICD-10-CM

## 2023-09-05 MED ORDER — PREDNISONE 20 MG PO TABS: 20.0000 mg | ORAL_TABLET | Freq: Every day | ORAL | 0 refills | Status: AC

## 2023-09-05 NOTE — Telephone Encounter (Unsigned)
 Copied from CRM [id number]. Topic: Clinical - Request for Lab/Test Order >> Sep 05, 2023  1:30 PM Martinique E wrote: Reason for CRM: Patient is requesting to have a Western Blot test to see if she has Lyme's disease. Patient stated she was just in office last week so she did not see the need for another OV, just a lab request. Callback number [phone].

## 2023-09-05 NOTE — Addendum Note (Signed)
 Addended by: METTA KRISTEN CROME on: 09/05/2023 09:46 AM   Modules accepted: Orders

## 2023-09-05 NOTE — Telephone Encounter (Signed)
 Please see result note

## 2023-09-05 NOTE — Telephone Encounter (Signed)
 Copied from CRM #[id number]. Topic: Clinical - Lab/Test Results >> Sep 05, 2023  8:10 AM [name] wrote: Reason for CRM: Patient calling for lab results. Call back # [phone].

## 2023-09-05 NOTE — Telephone Encounter (Signed)
 Lab ordered by Dr Micheal.

## 2023-09-07 ENCOUNTER — Encounter: Payer: Self-pay | Admitting: Family Medicine

## 2023-09-08 LAB — LYME DISEASE, WESTERN BLOT
IgG P18 Ab.: ABSENT
IgG P23 Ab.: ABSENT
IgG P28 Ab.: ABSENT
IgG P30 Ab.: ABSENT
IgG P39 Ab.: ABSENT
IgG P45 Ab.: ABSENT
IgG P58 Ab.: ABSENT
IgG P66 Ab.: ABSENT
IgG P93 Ab.: ABSENT
IgM P23 Ab.: ABSENT
IgM P39 Ab.: ABSENT
IgM P41 Ab.: ABSENT
Lyme IgG Wb: NEGATIVE
Lyme IgM Wb: NEGATIVE

## 2023-09-09 ENCOUNTER — Ambulatory Visit: Payer: Self-pay | Admitting: Family Medicine

## 2023-09-09 ENCOUNTER — Encounter: Payer: Self-pay | Admitting: Internal Medicine

## 2023-09-09 DIAGNOSIS — M2560 Stiffness of unspecified joint, not elsewhere classified: Secondary | ICD-10-CM

## 2023-09-09 DIAGNOSIS — M2550 Pain in unspecified joint: Secondary | ICD-10-CM

## 2023-09-15 ENCOUNTER — Encounter: Payer: Self-pay | Admitting: Internal Medicine

## 2023-09-19 ENCOUNTER — Ambulatory Visit: Payer: Self-pay | Admitting: Internal Medicine

## 2023-09-19 NOTE — Telephone Encounter (Signed)
 FYI Only or Action Required?: FYI only for provider.  Patient was last seen in primary care on 08/31/2023 by Micheal Wolm ORN, MD.  Called Nurse Triage reporting Joint Swelling.  Symptoms began several weeks ago.  Interventions attempted: Prescription medications: Prednisone .  Triage Disposition: See Physician Within 24 Hours  Patient/caregiver understands and will follow disposition?: Yes              Copied from CRM #[id number]. Topic: Clinical - Red Word Triage >> Sep 19, 2023 12:19 PM Lauren C wrote: Red Word that prompted transfer to Nurse Triage: Swollen hand and stiff neck. Was calling to request an appt for this. Dr. Theophilus referred her to rheumatology, but appt is end of Oct. Reason for Disposition  MODERATE hand swelling (e.g., visible swelling of hand and fingers; pitting edema)  Answer Assessment - Initial Assessment Questions This RN scheduled pt for an appointment with PCP tmrw in office. This RN educated pt on new-worsening symptoms and when to call back/seek emergent care. Pt verbalized understanding and agrees to plan.    Pt saw Dr. Micheal on 8/20 and was prescribed prednisone ; all symptoms came back after finishing medication Stiffness in neck; 9/10 when wakes up in morning Right hand swelling; left hand swelling a little- some pain in morning but when takes Advil it relives Pain in wrist Referral to rheum- appt on 10/27  Protocols used: Hand Swelling-A-AH

## 2023-09-20 ENCOUNTER — Ambulatory Visit: Admitting: Internal Medicine

## 2023-09-22 ENCOUNTER — Encounter: Payer: Self-pay | Admitting: Internal Medicine

## 2023-09-22 ENCOUNTER — Ambulatory Visit: Admitting: Internal Medicine

## 2023-09-22 VITALS — BP 110/64 | HR 88 | Temp 98.4°F | Wt 153.3 lb

## 2023-09-22 DIAGNOSIS — M2550 Pain in unspecified joint: Secondary | ICD-10-CM

## 2023-09-22 DIAGNOSIS — Z23 Encounter for immunization: Secondary | ICD-10-CM

## 2023-09-22 MED ORDER — COVID-19 MRNA VAC-TRIS(PFIZER) 30 MCG/0.3ML IM SUSY: 0.3000 mL | PREFILLED_SYRINGE | Freq: Once | INTRAMUSCULAR | 0 refills | Status: AC

## 2023-09-22 MED ORDER — DOXYCYCLINE HYCLATE 100 MG PO TABS: 100.0000 mg | ORAL_TABLET | Freq: Two times a day (BID) | ORAL | 0 refills | Status: AC

## 2023-09-22 MED ORDER — PREDNISONE 10 MG (21) PO TBPK
ORAL_TABLET | ORAL | 0 refills | Status: AC
Start: 2023-09-22 — End: ?

## 2023-09-22 MED ORDER — COVID-19 MRNA VAC-TRIS(PFIZER) 30 MCG/0.3ML IM SUSY: 0.3000 mL | PREFILLED_SYRINGE | Freq: Once | INTRAMUSCULAR | 0 refills | Status: DC

## 2023-09-22 NOTE — Progress Notes (Signed)
 Established Patient Office Visit     CC/Reason for Visit: Diffuse joint inflammation and pain  HPI: [name] is a 78 y.o. female who is coming in today for the above mentioned reasons.  This is now her second visit to this office for the same.  She has also seen orthopedics twice.  Her issues started around July 4.  She states she was in the yard weeding, a day or 2 later noticed a red spot on her left forearm, paid no attention to this.  She then developed pain of her left hand, her MCP and PIP joints where markedly inflamed.  She saw orthopedics for the first time then and was given a prednisone  taper which completely resolved her symptoms.  As soon as she finished the prednisone  the joint swelling and pain returned with a vengeance now involving both hands, shoulders, feet mainly in the metatarsal phalangeal joints and ankles as well as her neck.  She saw another in office provider and was given a second dose of prednisone , again had full relief while on prednisone .  She had a multitude of labs drawn which were negative including Lyme titers (done almost 6 weeks after beginning of symptoms), negative rheumatoid factor, negative anti-CCP, CRP and ESR within normal limits.  She is again here today with joint inflammation.  A referral to rheumatology was placed but they cannot see her until late October.  She believes she has been exposed to Lyme.  She thinks the red spot she had on her forearm after being in the yard was a tick bite.   Past Medical/Surgical History: Past Medical History:  Diagnosis Date   Anxiety    Arthritis    Knees,back,   Hypothyroidism    Osteopenia     Past Surgical History:  Procedure Laterality Date   COLONOSCOPY  2014   TOTAL KNEE ARTHROPLASTY Left 11/06/2018   Procedure: LEFT TOTAL KNEE ARTHROPLASTY;  Surgeon: Liam Lerner, MD;  Location: [facility];  Service: Orthopedics;  Laterality: Left;    Social History:  reports that she quit smoking about  12 years ago. Her smoking use included cigarettes. She started smoking about 42 years ago. She has a 15 pack-year smoking history. She has never used smokeless tobacco. She reports current alcohol  use. She reports that she does not use drugs.  Allergies: No Known Allergies  Family History:  Family History  Problem Relation Age of Onset   Heart disease Mother    Arthritis Mother    Hypertension Father    Arthritis Father      Current Outpatient Medications:    cetirizine  (ZYRTEC  ALLERGY) 10 MG tablet, Take 1 tablet (10 mg total) by mouth daily. (Patient taking differently: Take 10 mg by mouth daily as needed.), Disp: 30 tablet, Rfl: 0   doxycycline  (VIBRA -TABS) 100 MG tablet, Take 1 tablet (100 mg total) by mouth 2 (two) times daily for 14 days., Disp: 28 tablet, Rfl: 0   ibuprofen (ADVIL) 200 MG tablet, Take 200 mg by mouth in the morning and at bedtime., Disp: , Rfl:    levothyroxine  (SYNTHROID ) 25 MCG tablet, TAKE 1 TABLET BY MOUTH EVERY DAY BEFORE BREAKFAST, Disp: 90 tablet, Rfl: 1   LORazepam  (ATIVAN ) 0.5 MG tablet, TAKE 1 TABLET BY MOUTH EVERY DAY AS NEEDED FOR ANXIETY, Disp: 90 tablet, Rfl: 0   meloxicam (MOBIC) 7.5 MG tablet, Take 7.5 mg by mouth daily., Disp: , Rfl:    Multiple Vitamins-Minerals (CENTRUM ADULT PO), Take by  mouth., Disp: , Rfl:    Omega-3 Fatty Acids (FISH OIL) 1200 MG CAPS, Take 1 capsule by mouth daily., Disp: , Rfl:    Polyethyl Glycol-Propyl Glycol 0.4-0.3 % SOLN, Apply 1 drop to eye 3 (three) times daily., Disp: , Rfl:    predniSONE  (STERAPRED UNI-PAK 21 TAB) 10 MG (21) TBPK tablet, Take as directed, Disp: 21 tablet, Rfl: 0   vitamin C (ASCORBIC ACID) 500 MG tablet, Take 500 mg by mouth daily., Disp: , Rfl:    vitamin E 1000 UNIT capsule, Take 1,000 Units by mouth daily. , Disp: , Rfl:    zolpidem  (AMBIEN ) 10 MG tablet, TAKE 1 TABLET BY MOUTH AT BEDTIME AS NEEDED FOR SLEEP *INS MAX OF 15 IN 25 DAYS*, Disp: 15 tablet, Rfl: 1   COVID-19 mRNA vaccine, Pfizer,  (COMIRNATY) syringe, Inject 0.3 mLs into the muscle once for 1 dose., Disp: 0.3 mL, Rfl: 0  Review of Systems:  Negative unless indicated in HPI.   Physical Exam: Vitals:   09/22/23 1332  BP: 110/64  Pulse: 88  Temp: 98.4 F (36.9 C)  TempSrc: Oral  SpO2: 99%  Weight: 153 lb 4.8 oz (69.5 kg)    Body mass index is 28.97 kg/m.    Impression and Plan:  Arthralgia, unspecified joint -     predniSONE ; Take as directed  Dispense: 21 tablet; Refill: 0 -     Doxycycline  Hyclate; Take 1 tablet (100 mg total) by mouth 2 (two) times daily for 14 days.  Dispense: 28 tablet; Refill: 0  Immunization due -     COVID-19 mRNA Vac-TriS(Pfizer); Inject 0.3 mLs into the muscle once for 1 dose.  Dispense: 0.3 mL; Refill: 0  -Etiology of arthralgias and joint inflammation is unclear at this time but it is quite evident on exam particularly of bilateral MCP and PIP joints of her hand as well as her metatarsal phalangeal joints.  Her neck has felt quite stiff as well. - I have advised a course of Mobic, will send a prescription for prednisone  for her to keep at home just in case pain worsens until she can get into see rheumatology.  She is insistent on getting treatment for Lyme disease, as she has evident joint inflammation on exam today, I will go ahead and send a 14-day course of doxycycline  although I believe that negative titers for Lyme approximately 6 weeks after she noticed the rash on her forearm makes this unlikely.   Time spent:30 minutes reviewing chart, interviewing and examining patient and formulating plan of care.     Tully Theophilus Andrews, MD [facility] [address]

## 2023-10-12 ENCOUNTER — Other Ambulatory Visit: Payer: Self-pay | Admitting: Internal Medicine

## 2023-10-25 ENCOUNTER — Encounter: Payer: Self-pay | Admitting: Internal Medicine

## 2023-10-25 ENCOUNTER — Ambulatory Visit: Admitting: Internal Medicine

## 2023-10-25 VITALS — BP 128/72 | HR 87 | Temp 98.3°F | Wt 149.3 lb

## 2023-10-25 DIAGNOSIS — Z122 Encounter for screening for malignant neoplasm of respiratory organs: Secondary | ICD-10-CM

## 2023-10-25 DIAGNOSIS — E785 Hyperlipidemia, unspecified: Secondary | ICD-10-CM | POA: Diagnosis not present

## 2023-10-25 DIAGNOSIS — M8580 Other specified disorders of bone density and structure, unspecified site: Secondary | ICD-10-CM

## 2023-10-25 DIAGNOSIS — Z780 Asymptomatic menopausal state: Secondary | ICD-10-CM | POA: Diagnosis not present

## 2023-10-25 DIAGNOSIS — E039 Hypothyroidism, unspecified: Secondary | ICD-10-CM | POA: Diagnosis not present

## 2023-10-25 DIAGNOSIS — Z0000 Encounter for general adult medical examination without abnormal findings: Secondary | ICD-10-CM | POA: Diagnosis not present

## 2023-10-25 DIAGNOSIS — Z23 Encounter for immunization: Secondary | ICD-10-CM | POA: Diagnosis not present

## 2023-10-25 DIAGNOSIS — F17210 Nicotine dependence, cigarettes, uncomplicated: Secondary | ICD-10-CM

## 2023-10-25 LAB — COMPREHENSIVE METABOLIC PANEL WITH GFR
ALT: 11 U/L (ref 0–35)
AST: 13 U/L (ref 0–37)
Albumin: 4.2 g/dL (ref 3.5–5.2)
Alkaline Phosphatase: 79 U/L (ref 39–117)
BUN: 12 mg/dL (ref 6–23)
CO2: 27 meq/L (ref 19–32)
Calcium: 9.2 mg/dL (ref 8.4–10.5)
Chloride: 97 meq/L (ref 96–112)
Creatinine, Ser: 0.65 mg/dL (ref 0.40–1.20)
GFR: 85.02 mL/min (ref >60.00)
Glucose, Bld: 96 mg/dL (ref 70–99)
Potassium: 4.3 meq/L (ref 3.5–5.1)
Sodium: 133 meq/L — ABNORMAL LOW (ref 135–145)
Total Bilirubin: 0.5 mg/dL (ref 0.2–1.2)
Total Protein: 7.3 g/dL (ref 6.0–8.3)

## 2023-10-25 LAB — CBC WITH DIFFERENTIAL/PLATELET
Basophils Absolute: 0.1 K/uL (ref 0.0–0.1)
Basophils Relative: 0.6 % (ref 0.0–3.0)
Eosinophils Absolute: 0.1 K/uL (ref 0.0–0.7)
Eosinophils Relative: 0.5 % (ref 0.0–5.0)
HCT: 37.2 % (ref 36.0–46.0)
Hemoglobin: 12.3 g/dL (ref 12.0–15.0)
Lymphocytes Relative: 22 % (ref 12.0–46.0)
Lymphs Abs: 2 K/uL (ref 0.7–4.0)
MCHC: 33.2 g/dL (ref 30.0–36.0)
MCV: 95.2 fl (ref 78.0–100.0)
Monocytes Absolute: 0.7 K/uL (ref 0.1–1.0)
Monocytes Relative: 7.7 % (ref 3.0–12.0)
Neutro Abs: 6.3 K/uL (ref 1.4–7.7)
Neutrophils Relative %: 69.2 % (ref 43.0–77.0)
Platelets: 520 K/uL — ABNORMAL HIGH (ref 150.0–400.0)
RBC: 3.91 Mil/uL (ref 3.87–5.11)
RDW: 13.1 % (ref 11.5–15.5)
WBC: 9.1 K/uL (ref 4.0–10.5)

## 2023-10-25 LAB — LIPID PANEL
Cholesterol: 167 mg/dL (ref 0–200)
HDL: 76.9 mg/dL (ref >39.00)
LDL Cholesterol: 70 mg/dL (ref 0–99)
NonHDL: 89.79
Total CHOL/HDL Ratio: 2
Triglycerides: 101 mg/dL (ref 0.0–149.0)
VLDL: 20.2 mg/dL (ref 0.0–40.0)

## 2023-10-25 LAB — TSH: TSH: 3.59 u[IU]/mL (ref 0.35–5.50)

## 2023-10-25 NOTE — Patient Instructions (Signed)

## 2023-10-25 NOTE — Progress Notes (Signed)
 Subjective:   [name] is a 78 y.o. female who presents for Medicare Annual (Subsequent) preventive examination.  Visit Complete: In person  Patient Medicare AWV questionnaire was completed by the patient on 10/25/23; I have confirmed that all information answered by patient is correct and no changes since this date.  Cardiac Risk Factors include: advanced age (>28men, >47 women)     Objective:    Today's Vitals   10/25/23 1023 10/25/23 1040  BP: (!) 143/72 128/72  Pulse: 87   Temp: 98.3 F (36.8 C)   SpO2: 100%   Weight: 149 lb 4.8 oz (67.7 kg)    Body mass index is 28.21 kg/m.     10/25/2023   10:26 AM 10/14/2022    4:20 PM 11/06/2018   10:03 AM 11/06/2018    5:41 AM 10/31/2018   11:35 AM 06/21/2017   10:37 AM 06/16/2016   10:56 AM  Advanced Directives  Does Patient Have a Medical Advance Directive? Yes Yes Yes Yes Yes Yes  Yes   Type of Estate agent of [place];Living will Out of facility DNR (pink MOST or yellow form);Living will;Healthcare Power of Attorney Living will      Does patient want to make changes to medical advance directive? No - Patient declined No - Patient declined No - Patient declined No - Patient declined     Copy of Healthcare Power of Attorney in Chart?  No - copy requested          Data saved with a previous flowsheet row definition    Current Medications (verified) Outpatient Encounter Medications as of 10/25/2023  Medication Sig   cetirizine  (ZYRTEC  ALLERGY) 10 MG tablet Take 1 tablet (10 mg total) by mouth daily. (Patient taking differently: Take 10 mg by mouth daily as needed.)   ibuprofen (ADVIL) 200 MG tablet Take 200 mg by mouth in the morning and at bedtime.   levothyroxine  (SYNTHROID ) 25 MCG tablet TAKE 1 TABLET BY MOUTH EVERY DAY BEFORE BREAKFAST   LORazepam  (ATIVAN ) 0.5 MG tablet TAKE 1 TABLET BY MOUTH EVERY DAY AS NEEDED FOR ANXIETY   meloxicam (MOBIC) 7.5 MG tablet Take 7.5 mg by mouth daily.    Multiple Vitamins-Minerals (CENTRUM ADULT PO) Take by mouth.   Omega-3 Fatty Acids (FISH OIL) 1200 MG CAPS Take 1 capsule by mouth daily.   Polyethyl Glycol-Propyl Glycol 0.4-0.3 % SOLN Apply 1 drop to eye 3 (three) times daily.   predniSONE  (STERAPRED UNI-PAK 21 TAB) 10 MG (21) TBPK tablet Take as directed   vitamin C (ASCORBIC ACID) 500 MG tablet Take 500 mg by mouth daily.   vitamin E 1000 UNIT capsule Take 1,000 Units by mouth daily.    zolpidem  (AMBIEN ) 10 MG tablet TAKE 1 TABLET BY MOUTH AT BEDTIME AS NEEDED FOR SLEEP *INS MAX OF 15 IN 25 DAYS*   No facility-administered encounter medications on file as of 10/25/2023.    Allergies (verified) Patient has no known allergies.   History: Past Medical History:  Diagnosis Date   Anxiety    Arthritis    Knees,back,   Hypothyroidism    Osteopenia    Past Surgical History:  Procedure Laterality Date   COLONOSCOPY  2014   TOTAL KNEE ARTHROPLASTY Left 11/06/2018   Procedure: LEFT TOTAL KNEE ARTHROPLASTY;  Surgeon: Liam Lerner, MD;  Location: [facility];  Service: Orthopedics;  Laterality: Left;   Family History  Problem Relation Age of Onset   Heart disease Mother    Arthritis  Mother    Hypertension Father    Arthritis Father    Social History   Socioeconomic History   Marital status: Married    Spouse name: Not on file   Number of children: Not on file   Years of education: Not on file   Highest education level: Associate degree: academic program  Occupational History   Not on file  Tobacco Use   Smoking status: Former    Current packs/day: 0.00    Average packs/day: 0.5 packs/day for 30.0 years (15.0 ttl pk-yrs)    Types: Cigarettes    Start date: 03/11/1981    Quit date: 03/12/2011    Years since quitting: 12.6   Smokeless tobacco: Never  Vaping Use   Vaping status: Never Used  Substance and Sexual Activity   Alcohol  use: Yes    Comment: 2 glasses of wine per day   Drug use: No   Sexual activity: Not on file   Other Topics Concern   Not on file  Social History Narrative   Not on file   Social Drivers of Health   Financial Resource Strain: Low Risk  (10/25/2023)   Overall Financial Resource Strain (CARDIA)    Difficulty of Paying Living Expenses: Not hard at all  Food Insecurity: No Food Insecurity (10/25/2023)   Hunger Vital Sign    Worried About Running Out of Food in the Last Year: Never true    Ran Out of Food in the Last Year: Never true  Transportation Needs: No Transportation Needs (10/25/2023)   PRAPARE - Administrator, Civil Service (Medical): No    Lack of Transportation (Non-Medical): No  Physical Activity: Insufficiently Active (10/25/2023)   Exercise Vital Sign    Days of Exercise per Week: 2 days    Minutes of Exercise per Session: 30 min  Stress: Stress Concern Present (10/25/2023)   [employer] of Occupational Health - Occupational Stress Questionnaire    Feeling of Stress: To some extent  Social Connections: Socially Integrated (10/25/2023)   Social Connection and Isolation Panel    Frequency of Communication with Friends and Family: More than three times a week    Frequency of Social Gatherings with Friends and Family: Twice a week    Attends Religious Services: More than 4 times per year    Active Member of [employer] or Organizations: Yes    Attends Engineer, structural: More than 4 times per year    Marital Status: Married    Tobacco Counseling Counseling given: Not Answered   Clinical Intake:  Pre-visit preparation completed: Yes  Pain : No/denies pain     Nutritional Risks: None  How often do you need to have someone help you when you read instructions, pamphlets, or other written materials from your doctor or pharmacy?: 1 - Never  Interpreter Needed?: No      Activities of Daily Living    10/25/2023    9:11 AM 10/24/2023    2:39 PM  In your present state of health, do you have any difficulty performing the following  activities:  Hearing? 0 0  Vision? 0 0  Difficulty concentrating or making decisions? 0 0  Walking or climbing stairs? 1 1  Dressing or bathing? 0 0  Doing errands, shopping? 0 0  Preparing Food and eating ? N   Using the Toilet? N N  In the past six months, have you accidently leaked urine? Y   Do you have problems with loss of bowel  control? N   Managing your Medications? N N  Managing your Finances? N N  Housekeeping or managing your Housekeeping? Danielle Danielle    Patient Care Team: Theophilus Andrews, Tully CINDERELLA, MD as PCP - General (Internal Medicine)  Indicate any recent Medical Services you may have received from other than [facility] providers in the past year (date may be approximate).     Assessment:   This is a routine wellness examination for [facility].  Hearing/Vision screen No results found.   Goals Addressed               This Visit's Progress     Patient Stated (pt-stated)        Stay healthy and stay active       Depression Screen    10/25/2023   10:26 AM 10/27/2022   11:17 AM 10/18/2022    9:27 AM 10/14/2022    4:15 PM 09/29/2021   10:32 AM 02/12/2021    3:06 PM 12/31/2020   11:25 AM  PHQ 2/9 Scores  PHQ - 2 Score 0 0 0 0 0 0 0  PHQ- 9 Score  0 0   1 0    Fall Risk    10/25/2023    9:10 AM 10/24/2023    2:39 PM 10/27/2022   11:17 AM 10/14/2022    4:20 PM 09/29/2021   10:32 AM  Fall Risk   Falls in the past year? 1 1 0 0 1  Number falls in past yr: 0 0 0 0 0  Injury with Fall? 0 0 0 0 0  Risk for fall due to : History of fall(s)    No Fall Risks  Follow up Falls evaluation completed;Education provided  Falls evaluation completed Falls evaluation completed Falls evaluation completed      Data saved with a previous flowsheet row definition    MEDICARE RISK AT HOME: Medicare Risk at Home Any stairs in or around the home?: No If so, are there any without handrails?: No Home free of loose throw rugs in walkways, pet beds, electrical cords, etc?:  Yes Adequate lighting in your home to reduce risk of falls?: Yes Life alert?: No Use of a cane, walker or w/c?: No Grab bars in the bathroom?: No Elevated toilet seat or a handicapped toilet?: No  TIMED UP AND GO:  Was the test performed?  Yes  Length of time to ambulate 10 feet: 7 sec Gait steady and fast without use of assistive device    Cognitive Function:    06/21/2017   10:46 AM 06/16/2016   11:01 AM  MMSE - Mini Mental State Exam  Not completed: -- --        10/25/2023    9:11 AM 10/14/2022    4:20 PM  6CIT Screen  What Year? 0 points 0 points  What month? 0 points 0 points  What time? 0 points 0 points  Count back from 20 0 points 0 points  Months in reverse 0 points 0 points  Repeat phrase 2 points 0 points  Total Score 2 points 0 points    Immunizations Immunization History  Administered Date(s) Administered   Fluad Quad(high Dose 65+) 09/11/2020, 09/29/2021   INFLUENZA, HIGH DOSE SEASONAL PF 10/29/2013, 10/23/2014, 10/21/2015, 10/17/2016, 10/26/2017, 10/19/2018, 10/25/2023   Influenza Whole 11/24/2009   Influenza-Unspecified 10/17/2019, 10/08/2022   PFIZER Comirnaty(Gray Top)Covid-19 Tri-Sucrose Vaccine 06/17/2020   PFIZER(Purple Top)SARS-COV-2 Vaccination 02/05/2019, 02/26/2019, 10/29/2019   Pfizer Covid-19 Vaccine Bivalent Booster 97yrs & up 06/16/2021  Pfizer(Comirnaty)Fall Seasonal Vaccine 12 years and older 09/27/2022   Pneumococcal Conjugate-13 10/23/2014   Pneumococcal Polysaccharide-23 03/17/2016, 10/26/2017   RSV,unspecified 09/20/2022   Tdap 06/11/2012   Unspecified SARS-COV-2 Vaccination 10/17/2023   Zoster Recombinant(Shingrix) 10/09/2020, 02/06/2021    TDAP status: Up to date completed at [facility]  Flu Vaccine status: Due, Education has been provided regarding the importance of this vaccine. Advised may receive this vaccine at local pharmacy or Health Dept. Aware to provide a copy of the vaccination record if obtained from local pharmacy or  Health Dept. Verbalized acceptance and understanding.  Pneumococcal vaccine status: Up to date  Covid-19 vaccine status: Completed vaccines  Qualifies for Shingles Vaccine? Yes   Zostavax completed No   Shingrix Completed?: Yes  Screening Tests Health Maintenance  Topic Date Due   DTaP/Tdap/Td (2 - Td or Tdap) 06/12/2022   Mammogram  04/03/2024   COVID-19 Vaccine (8 - 2025-26 season) 04/16/2024   Medicare Annual Wellness (AWV)  10/24/2024   Colonoscopy  02/08/2028   Pneumococcal Vaccine: 50+ Years  Completed   Influenza Vaccine  Completed   DEXA SCAN  Completed   Hepatitis C Screening  Completed   Zoster Vaccines- Shingrix  Completed   Meningococcal B Vaccine  Aged Out    Health Maintenance  Health Maintenance Due  Topic Date Due   DTaP/Tdap/Td (2 - Td or Tdap) 06/12/2022    Colorectal cancer screening: No longer required.   Mammogram status: No longer required due to age.  Bone Density status: Completed 03/18/21. Results reflect: Bone density results: OSTEOPOROSIS. Repeat every 2 years.  Lung Cancer Screening: (Low Dose CT Chest recommended if Age 27-80 years, 20 pack-year currently smoking OR have quit w/in 15years.) does not qualify.   Lung Cancer Screening Referral: n/a  Additional Screening:  Hepatitis C Screening: does qualify; Completed 06/22/2016  Vision Screening: Recommended annual ophthalmology exams for early detection of glaucoma and other disorders of the eye.  Dental Screening: Recommended annual dental exams for proper oral hygiene   Community Resource Referral / Chronic Care Management: CRR required this visit?  No   CCM required this visit?  No     Plan:     I have personally reviewed and noted the following in the patient's chart:   Medical and social history Use of alcohol , tobacco or illicit drugs  Current medications and supplements including opioid prescriptions. Patient is not currently taking opioid prescriptions. Functional  ability and status Nutritional status Physical activity Advanced directives List of other physicians Hospitalizations, surgeries, and ER visits in previous 12 months Vitals Screenings to include cognitive, depression, and falls Referrals and appointments  In addition, I have reviewed and discussed with patient certain preventive protocols, quality metrics, and best practice recommendations. A written personalized care plan for preventive services as well as general preventive health recommendations were provided to patient.     Shanda LELON Abbott, CMA   10/25/2023   After Visit Summary: (In Person-Declined) Patient declined AVS at this time.

## 2023-10-25 NOTE — Progress Notes (Signed)
 Established Patient Office Visit     CC/Reason for Visit: Subsequent Medicare wellness visit  HPI: [name] is a 78 y.o. female who is coming in today for the above mentioned reasons. Past Medical History is significant for: Hypothyroidism, hyperlipidemia, osteopenia.  Feeling well, no acute concerns or complaints.  Has her appointment to see rheumatology at the end of the month.  Is overdue for DEXA scan.  Is due for low-dose CT scan for lung cancer screening, others cancer screening is up-to-date.  Has routine eye and dental care.   Past Medical/Surgical History: Past Medical History:  Diagnosis Date   Anxiety    Arthritis    Knees,back,   Hypothyroidism    Osteopenia     Past Surgical History:  Procedure Laterality Date   COLONOSCOPY  2014   TOTAL KNEE ARTHROPLASTY Left 11/06/2018   Procedure: LEFT TOTAL KNEE ARTHROPLASTY;  Surgeon: Liam Lerner, MD;  Location: [facility];  Service: Orthopedics;  Laterality: Left;    Social History:  reports that she quit smoking about 12 years ago. Her smoking use included cigarettes. She started smoking about 42 years ago. She has a 15 pack-year smoking history. She has never used smokeless tobacco. She reports current alcohol  use. She reports that she does not use drugs.  Allergies: No Known Allergies  Family History:  Family History  Problem Relation Age of Onset   Heart disease Mother    Arthritis Mother    Hypertension Father    Arthritis Father      Current Outpatient Medications:    cetirizine  (ZYRTEC  ALLERGY) 10 MG tablet, Take 1 tablet (10 mg total) by mouth daily. (Patient taking differently: Take 10 mg by mouth daily as needed.), Disp: 30 tablet, Rfl: 0   ibuprofen (ADVIL) 200 MG tablet, Take 200 mg by mouth in the morning and at bedtime., Disp: , Rfl:    levothyroxine  (SYNTHROID ) 25 MCG tablet, TAKE 1 TABLET BY MOUTH EVERY DAY BEFORE BREAKFAST, Disp: 90 tablet, Rfl: 0   LORazepam  (ATIVAN ) 0.5 MG tablet, TAKE  1 TABLET BY MOUTH EVERY DAY AS NEEDED FOR ANXIETY, Disp: 90 tablet, Rfl: 0   meloxicam (MOBIC) 7.5 MG tablet, Take 7.5 mg by mouth daily., Disp: , Rfl:    Multiple Vitamins-Minerals (CENTRUM ADULT PO), Take by mouth., Disp: , Rfl:    Omega-3 Fatty Acids (FISH OIL) 1200 MG CAPS, Take 1 capsule by mouth daily., Disp: , Rfl:    Polyethyl Glycol-Propyl Glycol 0.4-0.3 % SOLN, Apply 1 drop to eye 3 (three) times daily., Disp: , Rfl:    predniSONE  (STERAPRED UNI-PAK 21 TAB) 10 MG (21) TBPK tablet, Take as directed, Disp: 21 tablet, Rfl: 0   vitamin C (ASCORBIC ACID) 500 MG tablet, Take 500 mg by mouth daily., Disp: , Rfl:    vitamin E 1000 UNIT capsule, Take 1,000 Units by mouth daily. , Disp: , Rfl:    zolpidem  (AMBIEN ) 10 MG tablet, TAKE 1 TABLET BY MOUTH AT BEDTIME AS NEEDED FOR SLEEP *INS MAX OF 15 IN 25 DAYS*, Disp: 15 tablet, Rfl: 1  Review of Systems:  Negative unless indicated in HPI.   Physical Exam: Vitals:   10/25/23 1023 10/25/23 1040  BP: (!) 143/72 128/72  Pulse: 87   Temp: 98.3 F (36.8 C)   SpO2: 100%   Weight: 149 lb 4.8 oz (67.7 kg)     Body mass index is 28.21 kg/m.   Physical Exam Vitals reviewed.  Constitutional:  General: She is not in acute distress.    Appearance: Normal appearance. She is not ill-appearing, toxic-appearing or diaphoretic.  HENT:     Head: Normocephalic.     Right Ear: Tympanic membrane, ear canal and external ear normal. There is no impacted cerumen.     Left Ear: Tympanic membrane, ear canal and external ear normal. There is no impacted cerumen.     Nose: Nose normal.     Mouth/Throat:     Mouth: Mucous membranes are moist.     Pharynx: Oropharynx is clear. No oropharyngeal exudate or posterior oropharyngeal erythema.  Eyes:     General: No scleral icterus.       Right eye: No discharge.        Left eye: No discharge.     Conjunctiva/sclera: Conjunctivae normal.     Pupils: Pupils are equal, round, and reactive to light.  Neck:      Vascular: No carotid bruit.  Cardiovascular:     Rate and Rhythm: Normal rate and regular rhythm.     Pulses: Normal pulses.     Heart sounds: Normal heart sounds.  Pulmonary:     Effort: Pulmonary effort is normal. No respiratory distress.     Breath sounds: Normal breath sounds.  Abdominal:     General: Abdomen is flat. Bowel sounds are normal.     Palpations: Abdomen is soft.  Musculoskeletal:        General: Normal range of motion.     Cervical back: Normal range of motion.  Skin:    General: Skin is warm and dry.  Neurological:     General: No focal deficit present.     Mental Status: She is alert and oriented to person, place, and time. Mental status is at baseline.  Psychiatric:        Mood and Affect: Mood normal.        Behavior: Behavior normal.        Thought Content: Thought content normal.        Judgment: Judgment normal.    Subsequent Medicare wellness visit   1. Risk factors, based on past  M,S,F - Cardiac Risk Factors include: advanced age (>14men, >49 women)   2.  Physical activities: Dietary issues and exercise activities discussed:      3.  Depression/mood:  Flowsheet Row Office Visit from 10/25/2023 in [facility] Total Score 0     4.  ADL's:    10/25/2023    9:11 AM 10/24/2023    2:39 PM  In your present state of health, do you have any difficulty performing the following activities:  Hearing? 0 0  Vision? 0 0  Difficulty concentrating or making decisions? 0 0  Walking or climbing stairs? 1 1  Dressing or bathing? 0 0  Doing errands, shopping? 0 0  Preparing Food and eating ? N   Using the Toilet? N N  In the past six months, have you accidently leaked urine? Y   Do you have problems with loss of bowel control? N   Managing your Medications? N N  Managing your Finances? N N  Housekeeping or managing your Housekeeping? Y Y     5.  Fall risk:     09/29/2021   10:32 AM 10/14/2022    4:20 PM  10/27/2022   11:17 AM 10/24/2023    2:39 PM 10/25/2023    9:10 AM  Fall Risk  Falls in the past year?  1 0 0 1 1  Was there an injury with Fall? 0 0 0 0 0  Fall Risk Category Calculator 1 0 0 1  1  Fall Risk Category (Retired) Low       (RETIRED) Patient Fall Risk Level Low fall risk       Patient at Risk for Falls Due to No Fall Risks    History of fall(s)  Fall risk Follow up Falls evaluation completed  Falls evaluation completed Falls evaluation completed  Falls evaluation completed;Education provided     Patient-reported   Data saved with a previous flowsheet row definition     6.  Home safety: No problems identified   7.  Height weight, and visual acuity: height and weight as above, vision/hearing: Vision is 20/20 with correction, no hearing impairment   8.  Counseling: Counseling given: Not Answered    9. Lab orders based on risk factors: Laboratory update will be reviewed   10. Cognitive assessment:    06/21/2017   10:46 AM 06/16/2016   11:01 AM  MMSE - Mini Mental State Exam  Not completed: -- --        10/25/2023    9:11 AM 10/14/2022    4:20 PM  6CIT Screen  What Year? 0 points 0 points  What month? 0 points 0 points  What time? 0 points 0 points  Count back from 20 0 points 0 points  Months in reverse 0 points 0 points  Repeat phrase 2 points 0 points  Total Score 2 points 0 points     11. Screening: Patient provided with a written and personalized 5-10 year screening schedule in the AVS. Health Maintenance  Topic Date Due   Breast Cancer Screening  04/03/2024   COVID-19 Vaccine (8 - 2025-26 season) 04/16/2024   Medicare Annual Wellness Visit  10/24/2024   Colon Cancer Screening  02/08/2028   DTaP/Tdap/Td vaccine (3 - Td or Tdap) 05/08/2033   Pneumococcal Vaccine for age over 3  Completed   Flu Shot  Completed   DEXA scan (bone density measurement)  Completed   Hepatitis C Screening  Completed   Zoster (Shingles) Vaccine  Completed   Meningitis B  Vaccine  Aged Out    12. Provider List Update: Patient Care Team    Relationship Specialty Notifications Start End  Theophilus Andrews, Tully GRADE, MD PCP - General Internal Medicine  03/23/18      13. Advance Directives: Does Patient Have a Medical Advance Directive?: Yes Type of Advance Directive: Healthcare Power of Attorney, Living will Does patient want to make changes to medical advance directive?: No - Patient declined  14. Opioids: Patient is not on any opioid prescriptions and has no risk factors for a substance use disorder.   15.   Goals       Patient Stated      Add some aerobics to exercise and continue to watch diet       Patient Stated (pt-stated)      Stay healthy and stay active      Stay Active and Independent-Low Back Pain      Timeframe:  Long-Range Goal Priority:  Low Start Date:                             Expected End Date:                       Follow  Up Date 10/14/2023    - park farther away at the shopping mall and walk the extra distance - take the stairs instead of the elevator - use fitness equipment    Why is this important?   Regular activity or exercise is important to managing back pain.  Activity helps to keep your muscles strong.  You will sleep better and feel more relaxed.  You will have more energy and feel less stressed.  If you are not active now, start slowly. Little changes make a big difference.  Rest, but not too much.  Stay as active as you can and listen to your body's signals.     Notes:       Weight (lb) < 145 lb (65.8 kg)      Check out  online nutrition programs as WikiBlast.com.cy and LimitLaws.com.cy; fit53me; Can use lose it  May want to keep a diary for approx 1 month; to see if weight is responds to calorie reduction You can use calorieking.com  Look for foods with whole wheat; bran; oatmeal etc Shot at the farmer's markets in season for fresher choices  Watch for hydrogenated on the label of oils which are  trans-fats.  Watch for high fructose corn syrup in snacks, yogurt or ketchup  Meats have less marbling; bright colored fruits and vegetables;  Canned; dump out liquid and wash vegetables. Be mindful of what we are eating  Portion control is essential to a health weight! Sit down; take a break and enjoy your meal; take smaller bites; put the fork down between bites;  It takes 20 minutes to get full; so check in with your fullness cues and stop eating when you start to fill full                I have personally reviewed and noted the following in the patient's chart:   Medical and social history Use of alcohol , tobacco or illicit drugs  Current medications and supplements Functional ability and status Nutritional status Physical activity Advanced directives List of other physicians Hospitalizations, surgeries, and ER visits in previous 12 months Vitals Screenings to include cognitive, depression, and falls Referrals and appointments  In addition, I have reviewed and discussed with patient certain preventive protocols, quality metrics, and best practice recommendations. A written personalized care plan for preventive services as well as general preventive health recommendations were provided to patient.   Impression and Plan:  Encounter for Medicare annual wellness exam  Osteopenia, unspecified location  Acquired hypothyroidism -     CBC with Differential/Platelet; Future -     Comprehensive metabolic panel with GFR; Future -     TSH; Future  Hyperlipidemia, unspecified hyperlipidemia type -     Lipid panel; Future  Postmenopausal estrogen deficiency -     DG Bone Density; Future  Screening for lung cancer  Cigarette nicotine dependence without complication -     CT CHEST LUNG CANCER SCREENING LOW DOSE WO CONTRAST; Future  Other orders -     Flu vaccine HIGH DOSE PF(Fluzone Trivalent)   -Recommend routine eye and dental care. -Healthy lifestyle discussed  in detail. -Labs to be updated today. -Prostate cancer screening: N/A Health Maintenance  Topic Date Due   Breast Cancer Screening  04/03/2024   COVID-19 Vaccine (8 - 2025-26 season) 04/16/2024   Medicare Annual Wellness Visit  10/24/2024   Colon Cancer Screening  02/08/2028   DTaP/Tdap/Td vaccine (3 - Td or Tdap) 05/08/2033   Pneumococcal Vaccine for age over  50  Completed   Flu Shot  Completed   DEXA scan (bone density measurement)  Completed   Hepatitis C Screening  Completed   Zoster (Shingles) Vaccine  Completed   Meningitis B Vaccine  Aged Out    - Flu vaccine in office today. - Advised to update COVID at pharmacy. - DEXA order placed. - Low-dose CT scan ordered given ongoing smoking use.    Tully Theophilus Andrews, MD [facility]

## 2023-10-26 ENCOUNTER — Ambulatory Visit: Payer: Self-pay | Admitting: Internal Medicine

## 2023-11-01 ENCOUNTER — Telehealth: Payer: Self-pay | Admitting: *Deleted

## 2023-11-01 NOTE — Telephone Encounter (Signed)
 Copied from CRM #[id number]. Topic: Clinical - Lab/Test Results >> Nov 01, 2023 10:10 AM Nessti S wrote: Reason for CRM: patient wants a printed copy of panel results and she come by and pick up. Call back number [phone]

## 2023-11-01 NOTE — Telephone Encounter (Signed)
 Copy ready for patient to pick up and patient is aware.

## 2023-11-06 NOTE — Progress Notes (Signed)
 Office Visit Note  Patient: [name]             Date of Birth: Dec 12, 1945           MRN: [id number]             PCP: Theophilus Andrews, Tully GRADE, MD Referring: Theophilus Andrews, Jonna* Visit Date: 11/07/2023 Occupation: @GUAROCC @  Subjective:  No chief complaint on file.    History of Present Illness: [name] is a 78 y.o. female ***   Activities of Daily Living:  Patient reports morning stiffness for *** {minute/hour:19697}.   Patient {ACTIONS;DENIES/REPORTS:21021675::Denies} nocturnal pain.  Difficulty dressing/grooming: {ACTIONS;DENIES/REPORTS:21021675::Denies} Difficulty climbing stairs: {ACTIONS;DENIES/REPORTS:21021675::Denies} Difficulty getting out of chair: {ACTIONS;DENIES/REPORTS:21021675::Denies} Difficulty using hands for taps, buttons, cutlery, and/or writing: {ACTIONS;DENIES/REPORTS:21021675::Denies}  No Rheumatology ROS completed.    Rheum History: # Diagnosed in ***.  Manifestation of disease:   Serologies: (+) *** (-) ***  Maintenance Labs: QuantiFERON: *** Hepatitis panel: ***  Current Treatment ***  Prior Treatments ***   PMFS History:  Patient Active Problem List   Diagnosis Date Noted   Hyperlipidemia 08/23/2019   S/P total knee arthroplasty, left 11/06/2018   Degenerative arthritis of left knee 11/03/2018   Insomnia 03/23/2018   Hypothyroidism 04/28/2017   Osteopenia 02/03/2007    Past Medical History:  Diagnosis Date   Anxiety    Arthritis    Knees,back,   Hypothyroidism    Osteopenia     Family History  Problem Relation Age of Onset   Heart disease Mother    Arthritis Mother    Hypertension Father    Arthritis Father    Past Surgical History:  Procedure Laterality Date   COLONOSCOPY  2014   TOTAL KNEE ARTHROPLASTY Left 11/06/2018   Procedure: LEFT TOTAL KNEE ARTHROPLASTY;  Surgeon: Liam Lerner, MD;  Location: [facility];  Service: Orthopedics;  Laterality: Left;   Social History   Social  History Narrative   Not on file   Immunization History  Administered Date(s) Administered   Fluad Quad(high Dose 65+) 09/11/2020, 09/29/2021   INFLUENZA, HIGH DOSE SEASONAL PF 10/29/2013, 10/23/2014, 10/21/2015, 10/17/2016, 10/26/2017, 10/19/2018, 10/25/2023   Influenza Whole 11/24/2009   Influenza-Unspecified 10/17/2019, 10/08/2022   PFIZER Comirnaty(Gray Top)Covid-19 Tri-Sucrose Vaccine 06/17/2020   PFIZER(Purple Top)SARS-COV-2 Vaccination 02/05/2019, 02/26/2019, 10/29/2019   Pfizer Covid-19 Vaccine Bivalent Booster 31yrs & up 06/16/2021   Pfizer(Comirnaty)Fall Seasonal Vaccine 12 years and older 09/27/2022   Pneumococcal Conjugate-13 10/23/2014   Pneumococcal Polysaccharide-23 03/17/2016, 10/26/2017   RSV,unspecified 09/20/2022   Respiratory Syncytial Virus Vaccine,Recomb Aduvanted(Arexvy) 09/20/2022   Tdap 06/11/2012, 05/09/2023   Unspecified SARS-COV-2 Vaccination 10/17/2023   Zoster Recombinant(Shingrix) 10/09/2020, 02/06/2021     Objective: Vital Signs: There were no vitals taken for this visit.   Physical Exam Vitals and nursing note reviewed.  HENT:     Head: Normocephalic and atraumatic.     Nose: Nose normal.  Eyes:     Conjunctiva/sclera: Conjunctivae normal.     Pupils: Pupils are equal, round, and reactive to light.  Cardiovascular:     Rate and Rhythm: Normal rate and regular rhythm.     Heart sounds: Normal heart sounds.  Pulmonary:     Effort: Pulmonary effort is normal.     Breath sounds: Normal breath sounds.  Skin:    General: Skin is warm and dry.  Neurological:     Mental Status: She is alert. Mental status is at baseline.  Psychiatric:        Mood and Affect: Mood  normal.        Behavior: Behavior normal.      Musculoskeletal Exam: ***  CDAI Exam: CDAI Score: -- Patient Global: --; Provider Global: -- Swollen: --; Tender: -- Joint Exam 11/07/2023   No joint exam has been documented for this visit   There is currently no information  documented on the homunculus. Go to the Rheumatology activity and complete the homunculus joint exam.  Investigation: No additional findings.  Imaging: No results found.  Recent Labs: Lab Results  Component Value Date   WBC 9.1 10/25/2023   HGB 12.3 10/25/2023   PLT 520.0 (H) 10/25/2023   NA 133 (L) 10/25/2023   K 4.3 10/25/2023   CL 97 10/25/2023   CO2 27 10/25/2023   GLUCOSE 96 10/25/2023   BUN 12 10/25/2023   CREATININE 0.65 10/25/2023   BILITOT 0.5 10/25/2023   ALKPHOS 79 10/25/2023   AST 13 10/25/2023   ALT 11 10/25/2023   PROT 7.3 10/25/2023   ALBUMIN 4.2 10/25/2023   CALCIUM 9.2 10/25/2023   GFRAA >60 11/07/2018   Lab Results  Component Value Date   ANA POSITIVE (A) 08/31/2023   RF 11 08/31/2023    Speciality Comments: No specialty comments available.  Procedures:  No procedures performed Allergies: Patient has no known allergies.   Assessment / Plan:     Visit Diagnoses: No diagnosis found.  #High risk medication use  Orders: No orders of the defined types were placed in this encounter.  No orders of the defined types were placed in this encounter.   I personally spent a total of *** minutes in the care of the patient today including {Time Based Coding:210964241}.  Follow-Up Instructions: No follow-ups on file.   Asberry Claw, DO

## 2023-11-07 ENCOUNTER — Ambulatory Visit

## 2023-11-07 ENCOUNTER — Ambulatory Visit
Admission: RE | Admit: 2023-11-07 | Discharge: 2023-11-07 | Disposition: A | Discharge status: Home / Self Care | Source: Ambulatory Visit

## 2023-11-07 VITALS — BP 127/74 | HR 74 | Temp 97.5°F | Resp 13 | Ht 61.75 in | Wt 152.0 lb

## 2023-11-07 DIAGNOSIS — M2550 Pain in unspecified joint: Secondary | ICD-10-CM | POA: Insufficient documentation

## 2023-11-07 DIAGNOSIS — M1120 Other chondrocalcinosis, unspecified site: Secondary | ICD-10-CM | POA: Insufficient documentation

## 2023-11-07 DIAGNOSIS — M19042 Primary osteoarthritis, left hand: Secondary | ICD-10-CM | POA: Diagnosis not present

## 2023-11-07 DIAGNOSIS — M542 Cervicalgia: Secondary | ICD-10-CM | POA: Diagnosis not present

## 2023-11-07 DIAGNOSIS — M4312 Spondylolisthesis, cervical region: Secondary | ICD-10-CM | POA: Diagnosis not present

## 2023-11-07 DIAGNOSIS — M19041 Primary osteoarthritis, right hand: Secondary | ICD-10-CM | POA: Diagnosis not present

## 2023-11-07 DIAGNOSIS — R7689 Other specified abnormal immunological findings in serum: Secondary | ICD-10-CM | POA: Diagnosis not present

## 2023-11-08 ENCOUNTER — Telehealth: Payer: Self-pay | Admitting: Internal Medicine

## 2023-11-08 ENCOUNTER — Other Ambulatory Visit: Payer: Self-pay | Admitting: Internal Medicine

## 2023-11-08 DIAGNOSIS — G4700 Insomnia, unspecified: Secondary | ICD-10-CM

## 2023-11-08 NOTE — Telephone Encounter (Signed)
 Order has been faxed and confirmed

## 2023-11-08 NOTE — Telephone Encounter (Signed)
 Copied from CRM [id number]. Topic: Clinical - Request for Lab/Test Order >> Nov 08, 2023  1:07 PM Aleatha C wrote: Reason for CRM: [facility] in [place] needs a order for patient bone density study [phone] for app on 12/5 at 1pm

## 2023-11-08 NOTE — Telephone Encounter (Signed)
 Error/njr

## 2023-11-10 ENCOUNTER — Ambulatory Visit (HOSPITAL_BASED_OUTPATIENT_CLINIC_OR_DEPARTMENT_OTHER)
Admission: RE | Admit: 2023-11-10 | Discharge: 2023-11-10 | Disposition: A | Discharge status: Home / Self Care | Source: Ambulatory Visit | Attending: Internal Medicine | Admitting: Internal Medicine

## 2023-11-10 DIAGNOSIS — F17210 Nicotine dependence, cigarettes, uncomplicated: Secondary | ICD-10-CM | POA: Diagnosis not present

## 2023-11-10 DIAGNOSIS — J432 Centrilobular emphysema: Secondary | ICD-10-CM | POA: Diagnosis not present

## 2023-11-10 DIAGNOSIS — I700 Atherosclerosis of aorta: Secondary | ICD-10-CM | POA: Diagnosis not present

## 2023-11-10 DIAGNOSIS — Z87891 Personal history of nicotine dependence: Secondary | ICD-10-CM | POA: Diagnosis not present

## 2023-11-22 NOTE — Progress Notes (Signed)
 Office Visit Note  Patient: [name]             Date of Birth: 08/14/1945           MRN: [id number]             PCP: Theophilus Andrews, Tully GRADE, MD Referring: Theophilus Andrews, Jonna* Visit Date: 11/29/2023 Occupation: Data Unavailable  Subjective:  Results   History of Present Illness: [name] is a 78 y.o. female who is presenting for new patient follow-up. She was last seen 11/07/23 for a positive ANA. At that encounter, concern was for seronegative RA v CPPD (given MRI findings of CPPD arthropathy).   Discussed XR results with patient that suggest significant joint space narrowing of 2nd-3rd MCP's, 2nd-4th PIP's and DIP's b/l w/ questionable erosions involving carpal joints and multiple PIP's.   Patient reports that her joints have improved recently, and she can now put her wedding ring on (but is worried if she can get it off). She states that the pain has improved, but the stiffness is still present. She admits to improvement in her neck pain. No other new symptoms.     Activities of Daily Living:  Patient reports morning stiffness for 0 minutes.   Patient Denies nocturnal pain.  Difficulty dressing/grooming: Denies Difficulty climbing stairs: Reports Difficulty getting out of chair: Denies Difficulty using hands for taps, buttons, cutlery, and/or writing: Reports  Review of Systems  Constitutional:  Negative for fatigue.  HENT:  Negative for mouth sores and mouth dryness.   Eyes:  Negative for dryness.  Respiratory:  Negative for shortness of breath.   Cardiovascular:  Negative for chest pain and palpitations.  Gastrointestinal:  Negative for blood in stool, constipation and diarrhea.  Endocrine: Negative for increased urination.  Genitourinary:  Negative for involuntary urination.  Musculoskeletal:  Positive for joint pain and joint pain. Negative for gait problem, joint swelling, myalgias, muscle weakness, morning stiffness, muscle tenderness and  myalgias.  Skin:  Negative for color change, rash, hair loss and sensitivity to sunlight.  Allergic/Immunologic: Negative for susceptible to infections.  Neurological:  Negative for dizziness and headaches.  Hematological:  Negative for swollen glands.  Psychiatric/Behavioral:  Positive for sleep disturbance. Negative for depressed mood. The patient is not nervous/anxious.     PMFS History:  Patient Active Problem List   Diagnosis Date Noted   Hyperlipidemia 08/23/2019   S/P total knee arthroplasty, left 11/06/2018   Degenerative arthritis of left knee 11/03/2018   Insomnia 03/23/2018   Hypothyroidism 04/28/2017   Osteopenia 02/03/2007    Past Medical History:  Diagnosis Date   Anxiety    Arthritis    Knees,back,   Hypothyroidism    Osteopenia     Family History  Problem Relation Age of Onset   Heart disease Mother    Arthritis Mother    Hypertension Father    Arthritis Father    Osteopenia Sister    Cancer Paternal Grandmother    Past Surgical History:  Procedure Laterality Date   COLONOSCOPY  2014   TOTAL KNEE ARTHROPLASTY Left 11/06/2018   Procedure: LEFT TOTAL KNEE ARTHROPLASTY;  Surgeon: Liam Lerner, MD;  Location: [facility];  Service: Orthopedics;  Laterality: Left;   Social History   Tobacco Use   Smoking status: Former    Current packs/day: 0.00    Average packs/day: 0.5 packs/day for 30.0 years (15.0 ttl pk-yrs)    Types: Cigarettes    Start date: 03/11/1981    Quit  date: 03/12/2011    Years since quitting: 12.7    Passive exposure: Current   Smokeless tobacco: Never  Vaping Use   Vaping status: Never Used  Substance Use Topics   Alcohol  use: Yes    Alcohol /week: 2.0 - 3.0 standard drinks of alcohol     Types: 2 - 3 Glasses of wine per week   Drug use: No   Social History   Social History Narrative   Not on file     Immunization History  Administered Date(s) Administered   Fluad Quad(high Dose 65+) 09/11/2020, 09/29/2021   INFLUENZA, HIGH DOSE  SEASONAL PF 10/29/2013, 10/23/2014, 10/21/2015, 10/17/2016, 10/26/2017, 10/19/2018, 10/25/2023   Influenza Whole 11/24/2009   Influenza-Unspecified 10/17/2019, 10/08/2022   PFIZER Comirnaty(Gray Top)Covid-19 Tri-Sucrose Vaccine 06/17/2020   PFIZER(Purple Top)SARS-COV-2 Vaccination 02/05/2019, 02/26/2019, 10/29/2019   Pfizer Covid-19 Vaccine Bivalent Booster 40yrs & up 06/16/2021   Pfizer(Comirnaty)Fall Seasonal Vaccine 12 years and older 09/27/2022   Pneumococcal Conjugate-13 10/23/2014   Pneumococcal Polysaccharide-23 03/17/2016, 10/26/2017   RSV,unspecified 09/20/2022   Respiratory Syncytial Virus Vaccine,Recomb Aduvanted(Arexvy) 09/20/2022   Tdap 06/11/2012, 05/09/2023   Unspecified SARS-COV-2 Vaccination 10/17/2023   Zoster Recombinant(Shingrix) 10/09/2020, 02/06/2021     Objective: Vital Signs: BP 129/76 (BP Location: Left Arm, Patient Position: Sitting, Cuff Size: Small)   Pulse 74   Temp (!) 97.3 F (36.3 C)   Resp 13   Ht 5' 1.5 (1.562 m)   Wt 152 lb 6.4 oz (69.1 kg)   BMI 28.33 kg/m    Physical Exam Vitals and nursing note reviewed.  HENT:     Head: Normocephalic and atraumatic.     Nose: Nose normal.  Eyes:     Conjunctiva/sclera: Conjunctivae normal.     Pupils: Pupils are equal, round, and reactive to light.  Pulmonary:     Effort: Pulmonary effort is normal. No respiratory distress.  Skin:    General: Skin is warm and dry.  Neurological:     Mental Status: She is alert. Mental status is at baseline.  Psychiatric:        Mood and Affect: Mood normal.        Behavior: Behavior normal.      Musculoskeletal Exam:   CDAI Exam: CDAI Score: -- Patient Global: --; Provider Global: -- Swollen: 6 ; Tender: 1  Joint Exam 11/29/2023      Right  Left  Wrist  Swollen Tender  Swollen   MCP 2  Swollen   Swollen   MCP 3  Swollen   Swollen      Investigation: No additional findings.  Imaging: CT CHEST LUNG CA SCREEN LOW DOSE W/O CM Result Date:  11/15/2023 CLINICAL DATA:  20 pack-year smoking history/quit 11 years ago. EXAM: CT CHEST WITHOUT CONTRAST LOW-DOSE FOR LUNG CANCER SCREENING TECHNIQUE: Multidetector CT imaging of the chest was performed following the standard protocol without IV contrast. RADIATION DOSE REDUCTION: This exam was performed according to the departmental dose-optimization program which includes automated exposure control, adjustment of the mA and/or kV according to patient size and/or use of iterative reconstruction technique. COMPARISON:  10/27/2022 FINDINGS: Cardiovascular: Aortic atherosclerosis. Normal heart size, without pericardial effusion. Mediastinum/Nodes: No mediastinal or hilar adenopathy, given limitations of unenhanced CT. Lungs/Pleura: No pleural fluid. Mild centrilobular emphysema. Isolated lingular calcified granuloma. Upper Abdomen: Normal imaged portions of the liver, spleen, stomach, pancreas, gallbladder, adrenal glands, kidneys. Musculoskeletal: Osteopenia. Mild-to-moderate T7 and mild T12 superior endplate compression deformities are unchanged. IMPRESSION: Lung rads 1, negative. Continue annual screening with low-dose chest  CT without contrast in 12 months. Aortic Atherosclerosis (ICD10-I70.0) and Emphysema (ICD10-J43.9). Electronically Signed   By: Rockey Kilts M.D.   On: 11/15/2023 11:50   DG Cervical Spine With Flex & Extend Result Date: 11/08/2023 CLINICAL DATA:  Rule out crowned dens syndrome.  Neck pain. EXAM: CERVICAL SPINE COMPLETE WITH FLEXION AND EXTENSION VIEWS COMPARISON:  None Available. FINDINGS: No acute fracture or subluxation of the cervical spine. There is straightening of normal cervical lordosis which may be positional or due to muscle spasm. Grade 1 C2-C3 and C3-C4 anterolisthesis on flexion. The visualized posterior elements and odontoid appear intact. There is anatomic alignment of the lateral masses of C1 and C2. No periodontal calcification noted. The soft tissues are unremarkable.  IMPRESSION: 1. No acute fracture or subluxation of the cervical spine. 2. Grade 1 C2-C3 and C3-C4 anterolisthesis on flexion. Electronically Signed   By: Vanetta Chou M.D.   On: 11/08/2023 20:42   DG Hand 2 View Left Result Date: 11/08/2023 EXAM: 1 or 2 VIEW(S) XRAY OF THE LEFT HAND 11/07/2023 10:32:20 AM COMPARISON: None available. CLINICAL HISTORY: bilateral hand pain. Pt had neck pain July following a lyme's disease scare. The neck pain has gone away but MD thinks there's possible arthritis in it. Pt has had pain in both hands since July with swelling around her knuckles. The pain gets worse with use. FINDINGS: BONES AND JOINTS: First digit carpometacarpal joint space osteophyte formation with radial subluxation at first MCP joint. Second and third MCP joint osteophyte formation. Distal interphalangeal joint osteophyte formation. Second, third and fourth digit PIP joint osteophytes. Moderate triscaphe narrowing. Diffuse osteopenia. SOFT TISSUES: The soft tissues are unremarkable. IMPRESSION: 1. Osteoarthritis involving the first carpometacarpal joint, first MCP joint, second and third MCP joints, distal interphalangeal joints, and second, third, and fourth PIP joints. 2. Moderate triscaphe joint degenerative narrowing. Electronically signed by: Donnice Mania MD 11/08/2023 02:58 PM EDT RP Workstation: [id number]   DG Hand 2 View Right Result Date: 11/07/2023 EXAM: 1 or 2 VIEW(S) XRAY OF THE RIGHT HAND 11/07/2023 10:32:20 AM COMPARISON: None available. CLINICAL HISTORY: bilateral hand pain. Pt had neck pain July following a lyme's disease scare. The neck pain has gone away but MD thinks there's possible arthritis in it. Pt has had pain in both hands since July with swelling around her knuckles. The pain gets worse with use. FINDINGS: BONES AND JOINTS: No acute fracture. No focal osseous lesion. No joint dislocation. Moderate first CMC and triscaphe degenerative change. Mild diffuse IP joint space  narrowing. Mild joint space narrowing at the second and third MCP joints. SOFT TISSUES: The soft tissues are unremarkable. IMPRESSION: 1. Moderate first CMC and triscaphe degenerative change. 2. Mild diffuse IP joint space narrowing. 3. Mild joint space narrowing at the second and third MCP joints. Electronically signed by: Donnice Mania MD 11/07/2023 10:10 PM EDT RP Workstation: [id number]    Recent Labs: Lab Results  Component Value Date   WBC 9.1 10/25/2023   HGB 12.3 10/25/2023   PLT 520.0 (H) 10/25/2023   NA 133 (L) 10/25/2023   K 4.3 10/25/2023   CL 97 10/25/2023   CO2 27 10/25/2023   GLUCOSE 96 10/25/2023   BUN 12 10/25/2023   CREATININE 0.65 10/25/2023   BILITOT 0.5 10/25/2023   ALKPHOS 79 10/25/2023   AST 13 10/25/2023   ALT 11 10/25/2023   PROT 7.3 10/25/2023   ALBUMIN 4.2 10/25/2023   CALCIUM 9.2 10/25/2023   GFRAA >60 11/07/2018    Speciality Comments:  No specialty comments available.  Procedures:  No procedures performed Allergies: Patient has no known allergies.   Assessment / Plan:     Visit Diagnoses:  Seronegative rheumatoid arthritis (HCC) Patient with joint pain and swelling of b/l hands for >6 weeks w/ XR demonstrating significant joint space narrowing and possible erosions. All of this is concerning for a suspected dx of seronegative RA.   Discussed with patient that one option for treatment would be HCQ 200mg  BID M-F. Hydroxychloroquine typically is very well tolerated. The most common side effects are nausea and diarrhea, which often improve with time. Less common side effects include rash, hair changes, and muscle weakness. Rarely, hydroxychloroquine can lead to a pigmented retinopathy, cardiomyopathy, myopathy, arrhythmias due to prolonged QTc.  Discussed with patient the need for annual eye exams. Discussed recommendation is for a baseline eye exam within the first year of use, then annually after 5 years of starting HCQ. Patient was given handout on  Plaquenil and encouraged to ask questions.   Patient wishes to think about it prior to starting. Patient will reach out if she decides to proceed.  Orders: No orders of the defined types were placed in this encounter.  No orders of the defined types were placed in this encounter.   I personally spent a total of 30 minutes in the care of the patient today including preparing to see the patient, getting/reviewing separately obtained history, performing a medically appropriate exam/evaluation, counseling and educating, documenting clinical information in the EHR, independently interpreting results, and communicating results.   Follow-Up Instructions: Return in about 3 months (around 02/29/2024).   Asberry Claw, DO  Note - This record has been created using [employer].  Chart creation errors have been sought, but may not always  have been located. Such creation errors do not reflect on  the standard of medical care.

## 2023-11-29 ENCOUNTER — Ambulatory Visit

## 2023-11-29 VITALS — BP 129/76 | HR 74 | Temp 97.3°F | Resp 13 | Ht 61.5 in | Wt 152.4 lb

## 2023-11-29 DIAGNOSIS — M0600 Rheumatoid arthritis without rheumatoid factor, unspecified site: Secondary | ICD-10-CM | POA: Diagnosis not present

## 2023-11-29 DIAGNOSIS — M2550 Pain in unspecified joint: Secondary | ICD-10-CM | POA: Insufficient documentation

## 2023-11-29 DIAGNOSIS — R7689 Other specified abnormal immunological findings in serum: Secondary | ICD-10-CM | POA: Insufficient documentation

## 2023-11-29 DIAGNOSIS — M1120 Other chondrocalcinosis, unspecified site: Secondary | ICD-10-CM | POA: Insufficient documentation

## 2023-12-12 ENCOUNTER — Telehealth: Payer: Self-pay

## 2023-12-12 MED ORDER — HYDROXYCHLOROQUINE SULFATE 200 MG PO TABS: ORAL_TABLET | ORAL | 0 refills | Status: AC

## 2023-12-12 NOTE — Telephone Encounter (Signed)
 Patient called and would like to proceed with PLQ.  Preferred pharmacy: [facility] on [address].

## 2023-12-16 DIAGNOSIS — M810 Age-related osteoporosis without current pathological fracture: Secondary | ICD-10-CM | POA: Diagnosis not present

## 2023-12-16 LAB — HM DEXA SCAN

## 2023-12-19 ENCOUNTER — Encounter: Payer: Self-pay | Admitting: Internal Medicine

## 2023-12-26 ENCOUNTER — Other Ambulatory Visit: Payer: Self-pay | Admitting: Internal Medicine

## 2023-12-26 DIAGNOSIS — G4700 Insomnia, unspecified: Secondary | ICD-10-CM

## 2024-01-08 ENCOUNTER — Other Ambulatory Visit: Payer: Self-pay | Admitting: Internal Medicine

## 2024-02-13 ENCOUNTER — Other Ambulatory Visit: Payer: Self-pay | Admitting: Internal Medicine

## 2024-02-13 DIAGNOSIS — G4700 Insomnia, unspecified: Secondary | ICD-10-CM

## 2024-02-16 NOTE — Progress Notes (Unsigned)
 "  Office Visit Note  Patient: [name]             Date of Birth: 05/25/1945           MRN: [id number]             PCP: Theophilus Andrews, Tully GRADE, MD Referring: Theophilus Andrews, Tully GRADE, MD Visit Date: 02/29/2024 Occupation: Data Unavailable  Subjective:  No chief complaint on file.   History of Present Illness: [name] is a 79 y.o. female with Seronegative Rheumatoid Arthritis who is presenting for a 3 month follow up. She was last seen on 11/29/2023 where she was diagnosed with Rheumatoid Arthritis and HCQ was discussed.      Activities of Daily Living:  Patient reports morning stiffness for *** {minute/hour:19697}.   Patient {ACTIONS;DENIES/REPORTS:21021675::Denies} nocturnal pain.  Difficulty dressing/grooming: {ACTIONS;DENIES/REPORTS:21021675::Denies} Difficulty climbing stairs: {ACTIONS;DENIES/REPORTS:21021675::Denies} Difficulty getting out of chair: {ACTIONS;DENIES/REPORTS:21021675::Denies} Difficulty using hands for taps, buttons, cutlery, and/or writing: {ACTIONS;DENIES/REPORTS:21021675::Denies}  No Rheumatology ROS completed.   PMFS History:  Patient Active Problem List   Diagnosis Date Noted   Hyperlipidemia 08/23/2019   S/P total knee arthroplasty, left 11/06/2018   Degenerative arthritis of left knee 11/03/2018   Insomnia 03/23/2018   Hypothyroidism 04/28/2017   Osteopenia 02/03/2007    Past Medical History:  Diagnosis Date   Anxiety    Arthritis    Knees,back,   Hypothyroidism    Osteopenia     Family History  Problem Relation Age of Onset   Heart disease Mother    Arthritis Mother    Hypertension Father    Arthritis Father    Osteopenia Sister    Cancer Paternal Grandmother    Past Surgical History:  Procedure Laterality Date   COLONOSCOPY  2014   TOTAL KNEE ARTHROPLASTY Left 11/06/2018   Procedure: LEFT TOTAL KNEE ARTHROPLASTY;  Surgeon: Liam Lerner, MD;  Location: [facility];  Service: Orthopedics;  Laterality:  Left;   Social History[1] Social History   Social History Narrative   Not on file     Immunization History  Administered Date(s) Administered   Fluad Quad(high Dose 65+) 09/11/2020, 09/29/2021   INFLUENZA, HIGH DOSE SEASONAL PF 10/29/2013, 10/23/2014, 10/21/2015, 10/17/2016, 10/26/2017, 10/19/2018, 10/25/2023   Influenza Whole 11/24/2009   Influenza-Unspecified 10/17/2019, 10/08/2022   PFIZER Comirnaty(Gray Top)Covid-19 Tri-Sucrose Vaccine 06/17/2020   PFIZER(Purple Top)SARS-COV-2 Vaccination 02/05/2019, 02/26/2019, 10/29/2019   Pfizer Covid-19 Vaccine Bivalent Booster 30yrs & up 06/16/2021   Pfizer(Comirnaty)Fall Seasonal Vaccine 12 years and older 09/27/2022   Pneumococcal Conjugate-13 10/23/2014   Pneumococcal Polysaccharide-23 03/17/2016, 10/26/2017   RSV,unspecified 09/20/2022   Respiratory Syncytial Virus Vaccine,Recomb Aduvanted(Arexvy) 09/20/2022   Tdap 06/11/2012, 05/09/2023   Unspecified SARS-COV-2 Vaccination 10/17/2023   Zoster Recombinant(Shingrix) 10/09/2020, 02/06/2021     Objective: Vital Signs: There were no vitals taken for this visit.   Physical Exam   Musculoskeletal Exam: ***  CDAI Exam: CDAI Score: -- Patient Global: --; Provider Global: -- Swollen: --; Tender: -- Joint Exam 02/29/2024   No joint exam has been documented for this visit   There is currently no information documented on the homunculus. Go to the Rheumatology activity and complete the homunculus joint exam.  Investigation: No additional findings.  Imaging: No results found.  Recent Labs: Lab Results  Component Value Date   WBC 9.1 10/25/2023   HGB 12.3 10/25/2023   PLT 520.0 (H) 10/25/2023   NA 133 (L) 10/25/2023   K 4.3 10/25/2023   CL 97 10/25/2023   CO2 27 10/25/2023  GLUCOSE 96 10/25/2023   BUN 12 10/25/2023   CREATININE 0.65 10/25/2023   BILITOT 0.5 10/25/2023   ALKPHOS 79 10/25/2023   AST 13 10/25/2023   ALT 11 10/25/2023   PROT 7.3 10/25/2023   ALBUMIN  4.2 10/25/2023   CALCIUM 9.2 10/25/2023   GFRAA >60 11/07/2018    Speciality Comments: No specialty comments available.  Procedures:  No procedures performed Allergies: Patient has no known allergies.   Assessment / Plan:     Visit Diagnoses: No diagnosis found.  Orders: No orders of the defined types were placed in this encounter.  No orders of the defined types were placed in this encounter.   Face-to-face time spent with patient was *** minutes. Greater than 50% of time was spent in counseling and coordination of care.  Follow-Up Instructions: No follow-ups on file.   Alfonso Patterson, LPN  Note - This record has been created using [employer].  Chart creation errors have been sought, but may not always  have been located. Such creation errors do not reflect on  the standard of medical care.    [1]  Social History Tobacco Use   Smoking status: Former    Current packs/day: 0.00    Average packs/day: 0.5 packs/day for 30.0 years (15.0 ttl pk-yrs)    Types: Cigarettes    Start date: 03/11/1981    Quit date: 03/12/2011    Years since quitting: 12.9    Passive exposure: Current   Smokeless tobacco: Never  Vaping Use   Vaping status: Never Used  Substance Use Topics   Alcohol  use: Yes    Alcohol /week: 2.0 - 3.0 standard drinks of alcohol     Types: 2 - 3 Glasses of wine per week   Drug use: No   "

## 2024-02-29 ENCOUNTER — Ambulatory Visit

## 2024-02-29 DIAGNOSIS — M0600 Rheumatoid arthritis without rheumatoid factor, unspecified site: Secondary | ICD-10-CM
